# Patient Record
Sex: Male | Born: 1970 | Race: White | Hispanic: No | Marital: Married
Health system: Midwestern US, Academic
[De-identification: ages and names within clinical notes are randomized; demographics above are authoritative.]

---

## 2020-10-27 IMAGING — CT CHEST WO(Adult)
2 of 6 series · 15 of 36 positions shown, 18 images · non-contrast
Comparison: none

PROCEDURE: CHEST WO(Adult)
HISTORY: chest pain, LU chest changes on Xray. AK
TECHNIQUE: Axial CT imaging of the chest was performed without contrast. This exam
was performed using one or more the following dose reduction techniques: Automated
exposure control, adjustment of the mA and/or KV according to the patient's size or use of
iterative reconstruction technique. Total DLP dose measures 259 mGy with a total CTDI
dose measuring 7 mGy.

[Series 4: thorax cor 1.50 br40 s3 · coronal · 0.63mm/px · 3 of 218 slices shown]
[im 44/218  lung]
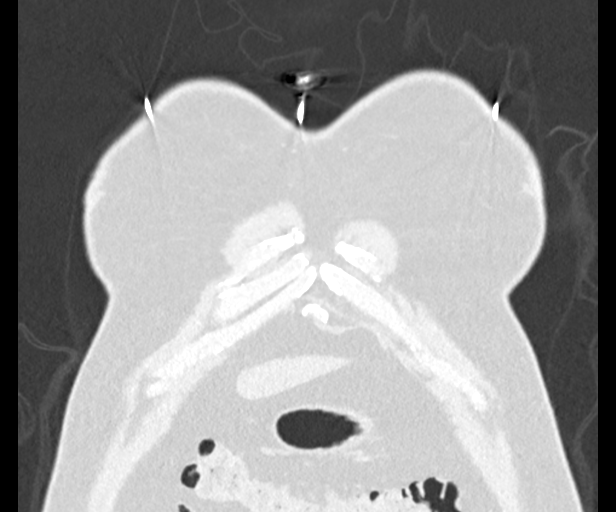
[im 87/218  lung]
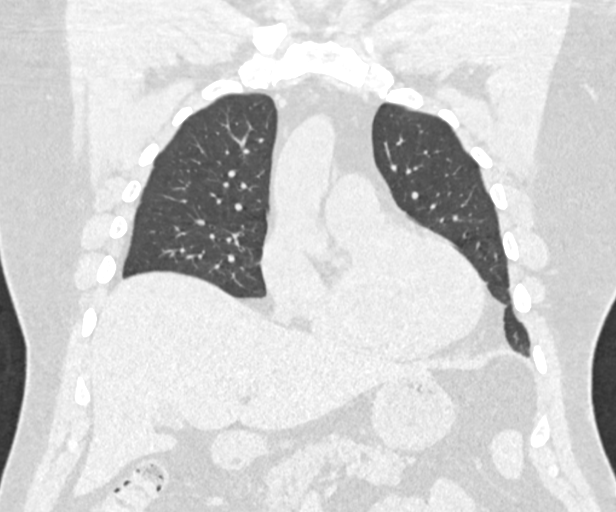
[im 131/218  lung]
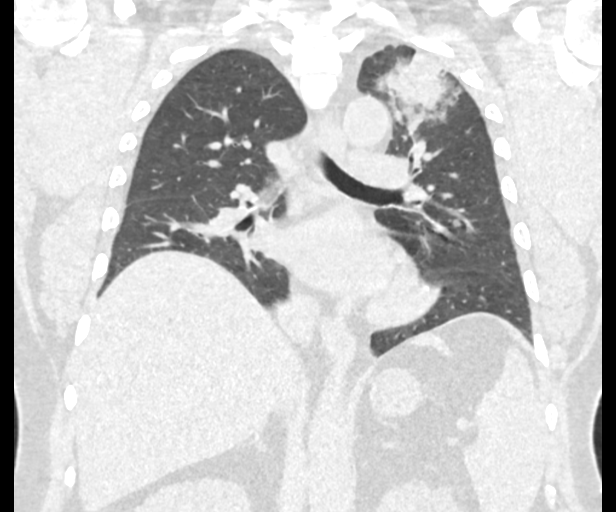

[Series 11: thorax 1.00 br60 s3 · axial · 0.76mm/px · z∈[+1604,+1875]mm · 12 of 458 slices shown, 15 images]
[im 36/458  mediastinal]
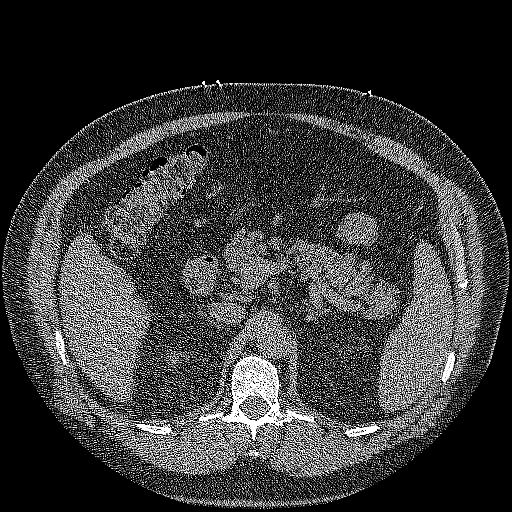
[im 36/458  lung]
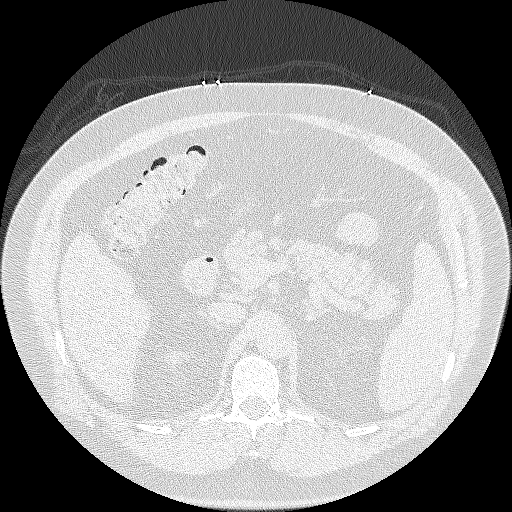
[im 71/458  lung]
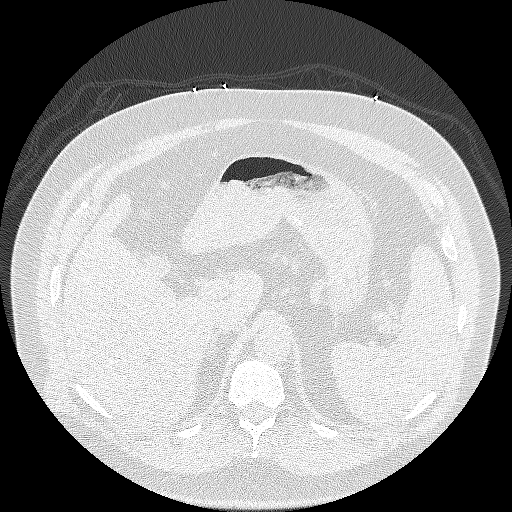
[im 106/458  lung]
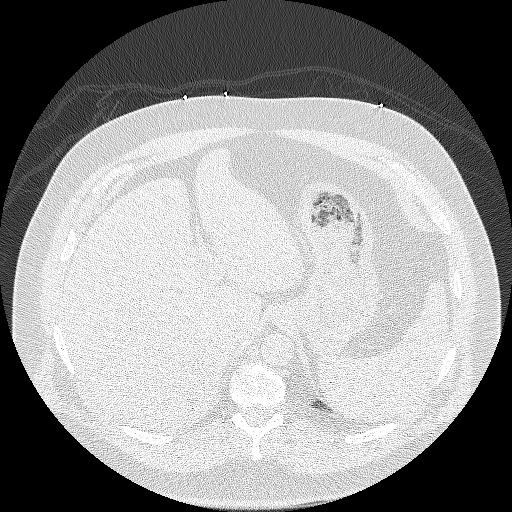
[im 141/458  lung]
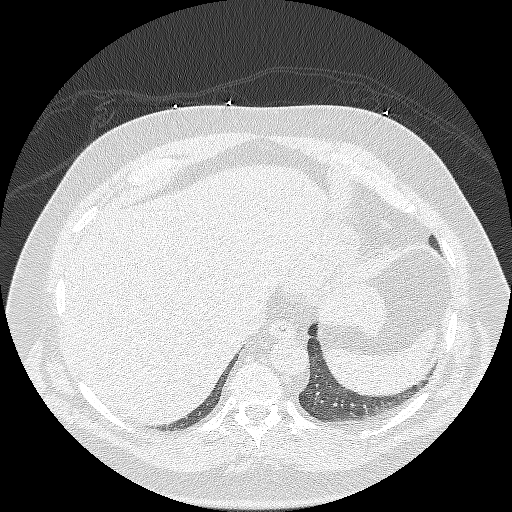
[im 176/458  mediastinal]
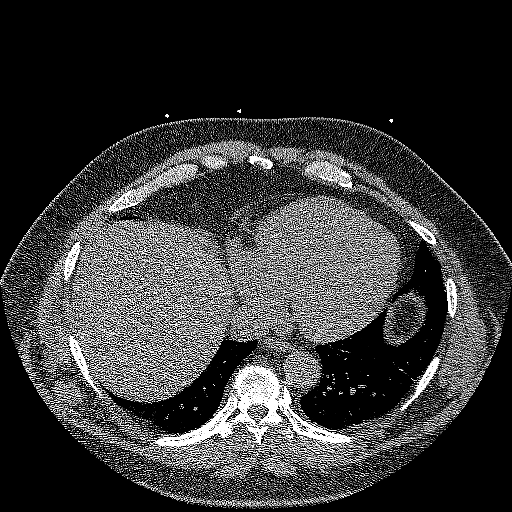
[im 176/458  lung]
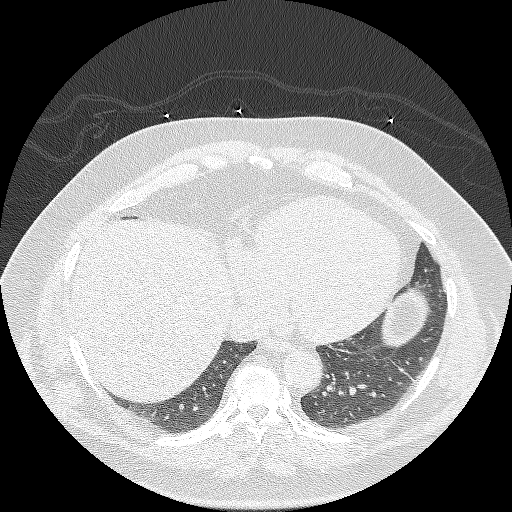
[im 211/458  lung]
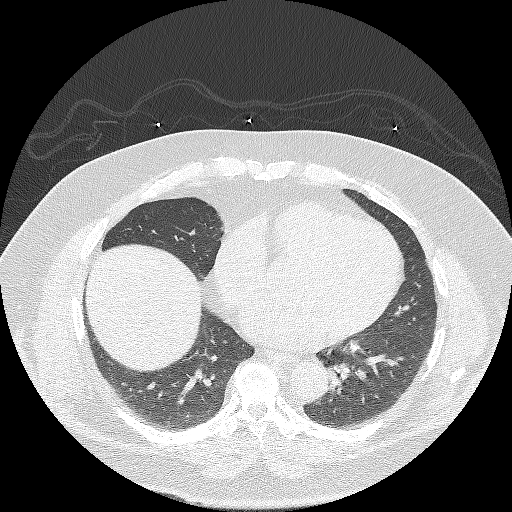
[im 247/458  lung]
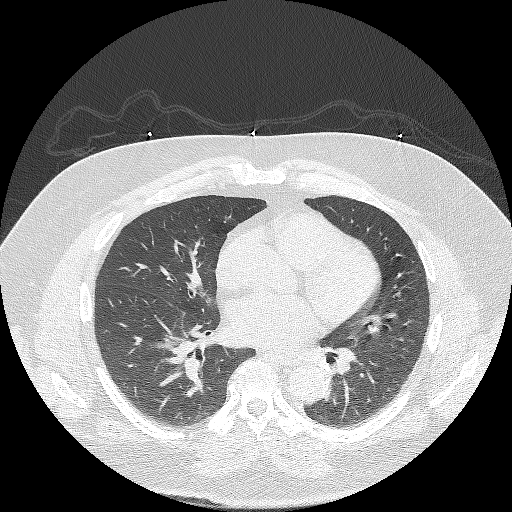
[im 282/458  lung]
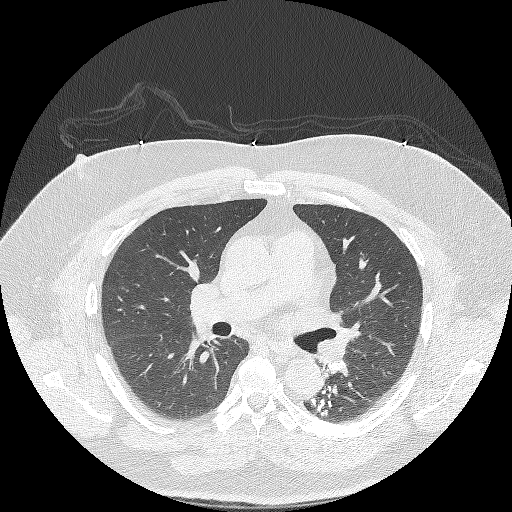
[im 317/458  mediastinal]
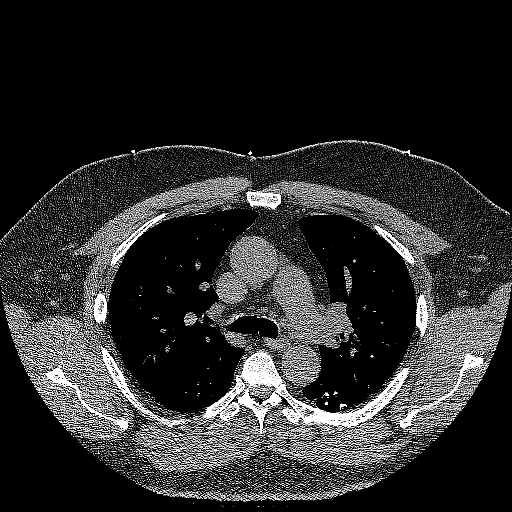
[im 317/458  lung]
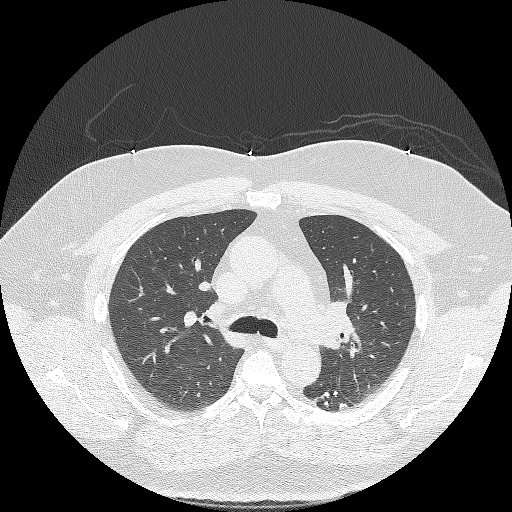
[im 352/458  lung]
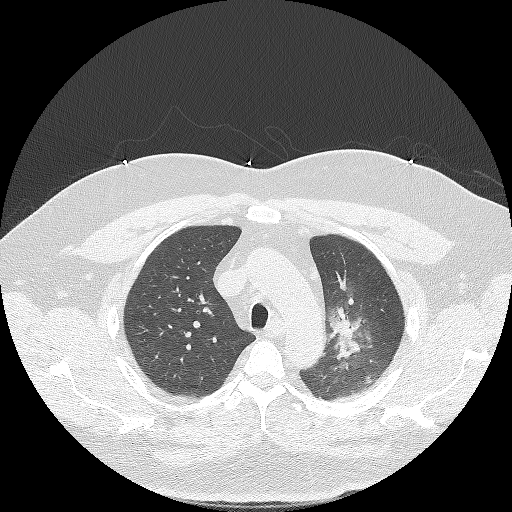
[im 387/458  lung]
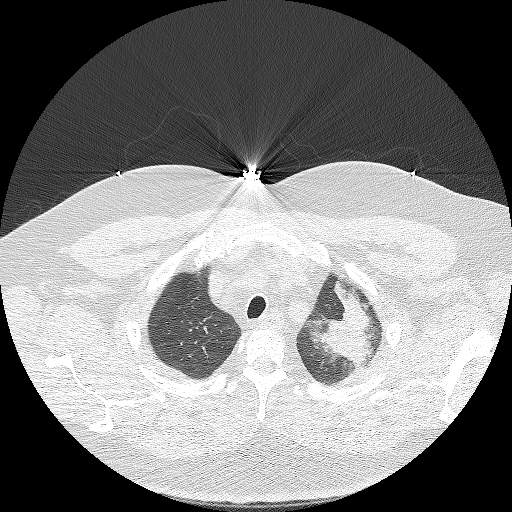
[im 422/458  lung]
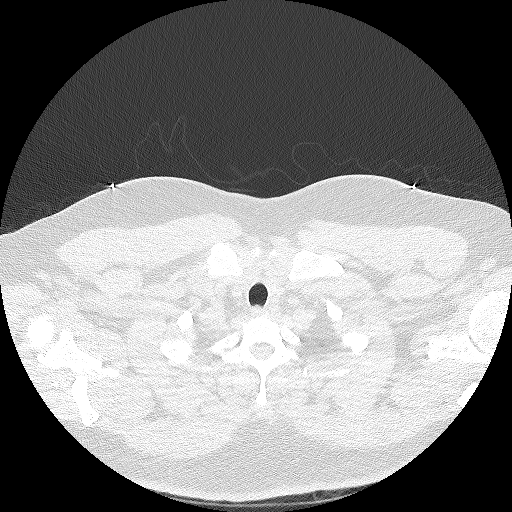

[15 of 36 positions shown; findings below may reference images not displayed]

FINDINGS: There is mild atherosclerotic calcification seen in the large vessels of the
mediastinum. I don't see any mediastinal adenopathy. Cardiac size is within normal limits.
There is no pleural effusion or pneumothorax. The lungs show dense left upper lobe
masslike airspace consolidation. No displaced rib fractures are seen. There are mild
degenerative changes seen in the mid thoracic spine. Limited visualization of the upper
abdomen is unremarkable.
IMPRESSION: 1. Dense left upper lobe pulmonary infiltrate is seen, follow up recommended to ensure
resolution.
2. No mediastinal adenopathy or pleural effusion is seen.

The sensitivity of contrast enhanced CT is much higher for detecting many pathologies over a
noncontrast CT. Correlate with the clinical suspicion for type of pathology.

Tech Notes:

read by maes

## 2021-01-13 ENCOUNTER — Encounter: Admit: 2021-01-13 | Discharge: 2021-01-13

## 2021-10-17 ENCOUNTER — Encounter: Admit: 2021-10-17 | Discharge: 2021-10-17

## 2022-05-09 ENCOUNTER — Encounter: Admit: 2022-05-09 | Discharge: 2022-05-09

## 2022-06-10 ENCOUNTER — Encounter: Admit: 2022-06-10 | Discharge: 2022-06-10

## 2022-06-17 ENCOUNTER — Encounter: Admit: 2022-06-17 | Discharge: 2022-06-17

## 2022-07-03 ENCOUNTER — Ambulatory Visit: Admit: 2022-07-03 | Discharge: 2022-07-03 | Payer: BC Managed Care – PPO

## 2022-07-03 ENCOUNTER — Encounter: Admit: 2022-07-03 | Discharge: 2022-07-03

## 2022-07-03 ENCOUNTER — Ambulatory Visit: Admit: 2022-07-03 | Discharge: 2022-07-03

## 2022-07-03 DIAGNOSIS — G5603 Carpal tunnel syndrome, bilateral upper limbs: Secondary | ICD-10-CM

## 2022-07-03 DIAGNOSIS — Z973 Presence of spectacles and contact lenses: Secondary | ICD-10-CM

## 2022-07-03 DIAGNOSIS — J45909 Unspecified asthma, uncomplicated: Secondary | ICD-10-CM

## 2022-07-03 DIAGNOSIS — M25532 Pain in left wrist: Secondary | ICD-10-CM

## 2022-07-03 DIAGNOSIS — I10 Essential (primary) hypertension: Secondary | ICD-10-CM

## 2022-07-03 DIAGNOSIS — G5601 Carpal tunnel syndrome, right upper limb: Secondary | ICD-10-CM

## 2022-07-03 DIAGNOSIS — E785 Hyperlipidemia, unspecified: Secondary | ICD-10-CM

## 2022-07-03 DIAGNOSIS — M1812 Unilateral primary osteoarthritis of first carpometacarpal joint, left hand: Secondary | ICD-10-CM

## 2022-07-03 DIAGNOSIS — F419 Anxiety disorder, unspecified: Secondary | ICD-10-CM

## 2022-07-03 MED ORDER — SODIUM BICARBONATE 1 MEQ/ML (8.4 %) IV SOLN
50 meq | Freq: Once | 0 refills
Start: 2022-07-03 — End: ?

## 2022-07-03 MED ORDER — LIDOCAINE-EPINEPHRINE 1 %-1:100,000 IJ SOLN
50 mL | Freq: Once | 0 refills
Start: 2022-07-03 — End: ?

## 2022-07-03 MED ORDER — TRIAMCINOLONE ACETONIDE 40 MG/ML IJ SUSP
40 mg | Freq: Once | INTRAMUSCULAR | 0 refills | Status: CP | PRN
Start: 2022-07-03 — End: ?

## 2022-07-03 MED ORDER — LIDOCAINE (PF) 10 MG/ML (1 %) IJ SOLN
1 mL | Freq: Once | INTRAMUSCULAR | 0 refills | Status: CP | PRN
Start: 2022-07-03 — End: ?

## 2022-07-03 MED ORDER — NAPROXEN 500 MG PO TAB
500 mg | ORAL_TABLET | Freq: Two times a day (BID) | ORAL | 0 refills | Status: AC
Start: 2022-07-03 — End: ?

## 2022-07-03 NOTE — Progress Notes
[facility] Hand Surgery      Date of Service: 07/03/2022    Subjective:           Bilateral hand numbness and left thumb pain    History of Present Illness  This is a 52 year old man presenting with years of bilateral hand numbness as well as left thumb pain.  There was no injury.  He reports constant numbness and tingling to the radial aspects of both hands.  Symptoms bother him day and night and wake him from sleep.  He is noticing some weakness in the bilateral hands.  He has tried some bracing which does not help.  Additionally he reports pain around the base of his left thumb which is worse with use.  This has not been treated.  Luis Griffith is a 52 y.o. male.     Review of Systems      Objective:          albuterol sulfate (PROAIR HFA) 90 mcg/actuation HFA aerosol inhaler Inhale one puff to two puffs by mouth into the lungs every 6 hours as needed.    amLODIPine (NORVASC) 10 mg tablet Take one tablet by mouth daily.    escitalopram oxalate (LEXAPRO) 5 mg tablet Take one tablet by mouth daily.    FLUoxetine (PROZAC) 20 mg capsule Take one capsule by mouth daily.    fluticasone propionate (FLOVENT DISKUS) 50 mcg/actuation inhaler Inhale one puff by mouth into the lungs daily.    phentermine (ADIPEX-P) 37.5 mg tablet Take one tablet by mouth every morning.    rosuvastatin (CRESTOR) 20 mg tablet Take one tablet by mouth daily.     Vitals:    07/03/22 1226   PainSc: Zero     There is no height or weight on file to calculate BMI.     Physical Exam  Constitutional:       General: He is not in acute distress.     Appearance: Normal appearance.   Neurological:      Mental Status: He is oriented to person, place, and time.   Psychiatric:         Behavior: Behavior normal.       Ortho Exam  Bilateral hands: The skin is intact, there are no wounds or scars.  He has full range of motion of the wrist and digits.  There is tenderness over the left thumb CMC joint with a positive CMC grind test. He has subjective tingling to the thumb index and middle finger bilaterally.  There is an equivocal direct compression Phalen's at the bilateral carpal tunnel.       Assessment and Plan:  I reviewed his electrodiagnostic studies which show evidence of compression of the median nerve at the bilateral wrist.  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  His right is worse than left.  I have offered him surgical treatment with a right carpal tunnel release under local anesthesia.  He would like to proceed.    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  For this I would recommend trying some conservative care with a steroid injection, brace, and oral anti-inflammatory.  He is welcome to follow-up in a month for this issue but has not helped.    Small Joint Injection/Aspiration: L thumb CMC on 07/03/2022 3:30 PM    Consent:   Consent obtained: verbal  Consent given by: patient  Risks discussed: skin discoloration, bleeding, damage to surrounding structures, hyperglycemia,  infection, pain, soft tissue reaction, subcutaneous fat atrophy, tendon rupture, vasovagal reaction and arrhythmia  Alternatives discussed: alternative treatment, delayed treatment and no treatment  Discussed with patient the purpose of the treatment/procedure, other ways of treating my condition, including no treatment/ procedure and the risks and benefits of the alternatives. Patient has decided to proceed with treatment/procedure.        Universal Protocol:  Relevant documents: relevant documents present and verified  Patient identity confirmed: Patient identify confirmed verbally with patient.          Procedures Details:  Procedure Peformed: Injection Only  Indications: pain  Details:Prep: alcohol   25 G needle, dorsal approachMedications: 40 mg triamcinolone acetonide 40 mg/mL; 1 mL lidocaine PF 1% (10 mg/mL)  Outcome: tolerated well, no immediate complications          The nature of the condition in general and specifically that involving the patient's condition has been reviewed in detail with the patient today.  The indications for surgical versus nonsurgical management of the condition has been advised, including the inherent risks, benefits, prognosis of the condition.  Surgical risks including infection, nerve or blood vessel injury, stiffness, chronic regional pain syndrome, incomplete recovery, incomplete relief or worsening of symptoms, recurrence have been advised.  The patient verbalizes a sound understanding of our discussion and elects to proceed with the proposed procedure.  All questions were answered and no guarantees have been given regarding the patient's condition and treatment recommendations.                       Philomena Course, MD  Associate Professor  Orthopedic Hand Surgery

## 2022-07-03 NOTE — Patient Instructions
It was a pleasure seeing you today. Please contact us if you have any further questions, we are happy to assist.     For nursing/medical/casting or splinting questions, please send a message through MyChart. This is our preferred method of contact and the most efficient way to contact your healthcare team. If you do not have internet access/smartphone you can call my Clinical Nurse Coordinator at [phone]. Please do not leave multiple voicemail's for us. Leaving multiple voicemail's delays us getting back to you quicker.    NOTE: MyChart messages and phone calls received on weekends, on holidays, and after 4 pm on weekdays will NOT be seen until the following business day.     - For medication refills, please ask your pharmacy to send an electronic request.  Please allow at least 3 business days for medication refills.    - To cancel, change, or schedule a clinic appointment: Call  Scheduling at [phone].  - For any radiology concerns or scheduling, please call [phone].     Any insurance, disability or Family Medical Leave Act (FMLA) forms can be faxed to [phone].  Be sure to include your name and date of birth on the form. We will complete and fax the forms where they need to go as soon as we are able. For any questions about disability or FMLA paperwork, please contact Dr. Drake's administrative assistant, Adrianna at [phone].    Do not hesitate to contact our office with any further questions.Thank you and have a great day!    Matthew Drake, MD   [facility]   Orthopedic & Sports Medicine  Office: [phone]  Fax: [phone]

## 2022-07-28 ENCOUNTER — Encounter: Admit: 2022-07-28 | Discharge: 2022-07-28

## 2022-07-28 MED ORDER — NAPROXEN 500 MG PO TAB
500 mg | ORAL_TABLET | Freq: Two times a day (BID) | ORAL | 0 refills | Status: AC
Start: 2022-07-28 — End: ?

## 2022-08-05 ENCOUNTER — Encounter: Admit: 2022-08-05 | Discharge: 2022-08-05

## 2022-08-06 ENCOUNTER — Encounter: Admit: 2022-08-06 | Discharge: 2022-08-06

## 2022-08-06 DIAGNOSIS — M25531 Pain in right wrist: Secondary | ICD-10-CM

## 2022-08-07 ENCOUNTER — Ambulatory Visit: Admit: 2022-08-07 | Discharge: 2022-08-07 | Payer: BC Managed Care – PPO

## 2022-08-07 ENCOUNTER — Encounter: Admit: 2022-08-07 | Discharge: 2022-08-07

## 2022-08-07 ENCOUNTER — Ambulatory Visit: Admit: 2022-08-07 | Discharge: 2022-08-07

## 2022-08-07 DIAGNOSIS — E785 Hyperlipidemia, unspecified: Secondary | ICD-10-CM

## 2022-08-07 DIAGNOSIS — G5603 Carpal tunnel syndrome, bilateral upper limbs: Secondary | ICD-10-CM

## 2022-08-07 DIAGNOSIS — F419 Anxiety disorder, unspecified: Secondary | ICD-10-CM

## 2022-08-07 DIAGNOSIS — M25531 Pain in right wrist: Secondary | ICD-10-CM

## 2022-08-07 DIAGNOSIS — Z973 Presence of spectacles and contact lenses: Secondary | ICD-10-CM

## 2022-08-07 DIAGNOSIS — M19039 Primary osteoarthritis, unspecified wrist: Secondary | ICD-10-CM

## 2022-08-07 DIAGNOSIS — I10 Essential (primary) hypertension: Secondary | ICD-10-CM

## 2022-08-07 DIAGNOSIS — J45909 Unspecified asthma, uncomplicated: Secondary | ICD-10-CM

## 2022-08-07 MED ORDER — TRIAMCINOLONE ACETONIDE 40 MG/ML IJ SUSP
40 mg | Freq: Once | INTRAMUSCULAR | 0 refills | Status: CP | PRN
Start: 2022-08-07 — End: ?

## 2022-08-07 MED ORDER — LIDOCAINE HCL 10 MG/ML (1 %) IJ SOLN
1 mL | Freq: Once | INTRAMUSCULAR | 0 refills | Status: CP | PRN
Start: 2022-08-07 — End: ?

## 2022-08-07 NOTE — Progress Notes
[facility] Hand Surgery      Date of Service: 08/07/2022    Subjective:           Right wrist pain    History of Present Illness  This is a 52 year old man I have evaluated in the past for bilateral carpal tunnel syndrome and left thumb CMC arthritis.  He is doing well with respect to his left thumb and he has surgery scheduled in October for his carpal tunnel.  He comes in today wishing to be evaluated for another issue.  He says he has had longstanding pain symptoms about his right wrist and recently returning a valve work and felt a painful pop.  He reports pain around the ulnar aspect of his wrist which is worse with motion.  Luis Griffith is a 52 y.o. male.     Review of Systems      Objective:          albuterol sulfate (PROAIR HFA) 90 mcg/actuation HFA aerosol inhaler Inhale one puff to two puffs by mouth into the lungs every 6 hours as needed.    amLODIPine (NORVASC) 10 mg tablet Take one tablet by mouth daily.    escitalopram oxalate (LEXAPRO) 5 mg tablet Take one tablet by mouth daily.    FLUoxetine (PROZAC) 20 mg capsule Take one capsule by mouth daily.    fluticasone propionate (FLOVENT DISKUS) 50 mcg/actuation inhaler Inhale one puff by mouth into the lungs daily.    naproxen (NAPROSYN) 500 mg tablet TAKE 1 TABLET BY MOUTH TWICE DAILY WITH MEALS    phentermine (ADIPEX-P) 37.5 mg tablet Take one tablet by mouth every morning.    rosuvastatin (CRESTOR) 20 mg tablet Take one tablet by mouth daily.     Vitals:    08/07/22 1452   PainSc: Six     There is no height or weight on file to calculate BMI.     Physical Exam  Constitutional:       General: He is not in acute distress.     Appearance: Normal appearance.   Neurological:      Mental Status: He is oriented to person, place, and time.   Psychiatric:         Behavior: Behavior normal.       Ortho Exam  Right wrist: The skin is intact, there are no wounds or scars.  There is some mild swelling about the wrist with some tenderness over the ulnar side.  He has mildly restricted range of motion of the wrist in both flexion and extension.  He can make a full fist.  There is intact sensation and brisk cap refill to all digits.       Assessment and Plan:  I reviewed radiographs of his wrist which show evidence of arthritic changes primarily of the radiolunate joint.  I suspect he aggravated that with his recent twisting injury.  I have offered him a steroid injection to help calm this down he would like to proceed.  He already has a wrist brace.  He is welcome to follow-up as needed.    Medium Joint Injection: R ulnocarpal on 08/07/2022 2:50 PM    Consent:   Consent obtained: verbal  Consent given by: patient  Risks discussed: arrhythmia, bleeding, damage to surrounding structures, hyperglycemia, infection, pain, skin discoloration, soft tissue reaction, subcutaneous fat atrophy, tendon rupture and vasovagal reaction  Alternatives discussed: alternative treatment, delayed treatment and no treatment  Discussed with patient the  purpose of the treatment/procedure, other ways of treating my condition, including no treatment/ procedure and the risks and benefits of the alternatives. Patient has decided to proceed with treatment/procedure.        Universal Protocol:  Relevant documents: relevant documents present and verified  Patient identity confirmed: Patient identify confirmed verbally with patient.          Procedures Details:  Procedure Peformed: Injection Only  Indications: pain  Details: 25 G needle, medial approachMedications: 40 mg triamcinolone acetonide 40 mg/mL; 1 mL lidocaine 1 % (10mg /mL)  Outcome: tolerated well, no immediate complications                             Philomena Course, MD  Associate Professor  Orthopedic Hand Surgery

## 2022-08-29 ENCOUNTER — Encounter: Admit: 2022-08-29 | Discharge: 2022-08-29

## 2022-08-29 MED ORDER — NAPROXEN 500 MG PO TAB
500 mg | ORAL_TABLET | Freq: Two times a day (BID) | ORAL | 0 refills
Start: 2022-08-29 — End: ?

## 2022-11-09 ENCOUNTER — Encounter: Admit: 2022-11-09 | Discharge: 2022-11-09

## 2022-11-16 ENCOUNTER — Ambulatory Visit: Admit: 2022-11-16 | Discharge: 2022-11-17 | Payer: BC Managed Care – PPO

## 2022-11-19 ENCOUNTER — Encounter: Admit: 2022-11-19 | Discharge: 2022-11-19

## 2022-11-19 DIAGNOSIS — J45909 Unspecified asthma, uncomplicated: Secondary | ICD-10-CM

## 2022-11-19 DIAGNOSIS — Z973 Presence of spectacles and contact lenses: Secondary | ICD-10-CM

## 2022-11-19 DIAGNOSIS — G5603 Carpal tunnel syndrome, bilateral upper limbs: Secondary | ICD-10-CM

## 2022-11-19 DIAGNOSIS — E785 Hyperlipidemia, unspecified: Secondary | ICD-10-CM

## 2022-11-19 DIAGNOSIS — I10 Essential (primary) hypertension: Secondary | ICD-10-CM

## 2022-11-19 DIAGNOSIS — F419 Anxiety disorder, unspecified: Secondary | ICD-10-CM

## 2022-11-20 ENCOUNTER — Encounter: Admit: 2022-11-20 | Discharge: 2022-11-20

## 2022-11-20 DIAGNOSIS — Z973 Presence of spectacles and contact lenses: Secondary | ICD-10-CM

## 2022-11-20 DIAGNOSIS — F419 Anxiety disorder, unspecified: Secondary | ICD-10-CM

## 2022-11-20 DIAGNOSIS — E785 Hyperlipidemia, unspecified: Secondary | ICD-10-CM

## 2022-11-20 DIAGNOSIS — I10 Essential (primary) hypertension: Secondary | ICD-10-CM

## 2022-11-20 DIAGNOSIS — G5603 Carpal tunnel syndrome, bilateral upper limbs: Secondary | ICD-10-CM

## 2022-11-20 DIAGNOSIS — J45909 Unspecified asthma, uncomplicated: Secondary | ICD-10-CM

## 2022-11-30 ENCOUNTER — Encounter: Admit: 2022-11-30 | Discharge: 2022-11-30

## 2022-11-30 ENCOUNTER — Ambulatory Visit: Admit: 2022-11-30 | Discharge: 2022-12-01 | Payer: BC Managed Care – PPO

## 2022-11-30 DIAGNOSIS — G5603 Carpal tunnel syndrome, bilateral upper limbs: Secondary | ICD-10-CM

## 2022-11-30 DIAGNOSIS — J45909 Unspecified asthma, uncomplicated: Secondary | ICD-10-CM

## 2022-11-30 DIAGNOSIS — Z4789 Encounter for other orthopedic aftercare: Secondary | ICD-10-CM

## 2022-11-30 DIAGNOSIS — Z973 Presence of spectacles and contact lenses: Secondary | ICD-10-CM

## 2022-11-30 DIAGNOSIS — F419 Anxiety disorder, unspecified: Secondary | ICD-10-CM

## 2022-11-30 DIAGNOSIS — E785 Hyperlipidemia, unspecified: Secondary | ICD-10-CM

## 2022-11-30 DIAGNOSIS — I10 Essential (primary) hypertension: Secondary | ICD-10-CM

## 2022-11-30 NOTE — Progress Notes
[facility] Hand Surgery      Date of Service: 11/30/2022    Subjective:           Postop check    History of Present Illness  The patient returns over a week status post his right carpal tunnel release.  He states pain has been minimal but he has not noticed any significant change in his preoperative symptoms.  Luis Griffith is a 52 y.o. male.     Review of Systems      Objective:          albuterol sulfate (PROAIR HFA) 90 mcg/actuation HFA aerosol inhaler Inhale one puff to two puffs by mouth into the lungs every 6 hours as needed.    amLODIPine (NORVASC) 10 mg tablet Take one tablet by mouth daily.    escitalopram oxalate (LEXAPRO) 5 mg tablet Take one tablet by mouth daily.    FLUoxetine (PROZAC) 20 mg capsule Take one capsule by mouth daily.    fluticasone propionate (FLOVENT DISKUS) 50 mcg/actuation inhaler Inhale one puff by mouth into the lungs daily.    naproxen (NAPROSYN) 500 mg tablet TAKE 1 TABLET BY MOUTH TWICE DAILY WITH MEALS    phentermine (ADIPEX-P) 37.5 mg tablet Take one tablet by mouth every morning.    rosuvastatin (CRESTOR) 20 mg tablet Take one tablet by mouth daily.     Vitals:    11/30/22 1322   PainSc: Zero     There is no height or weight on file to calculate BMI.     Physical Exam  Ortho Exam  Right hand: The surgical wound is healed, sutures are in place.  There is no erythema or drainage.  He has full range of motion of the wrist and digits.  He has subjective tingling towards the radial aspect of his right hand.       Assessment and Plan:  He is doing well, the wound has healed.  We will take out the sutures today.  No therapy is needed.  He has not had much of a response yet with respect to his preoperative symptoms, I counseled him that this can often take several weeks to a few months to get to a final result.  I would like him to return to activities as tolerated.  And he should follow-up in 6 weeks for recheck.  Problem   Right Carpal Tunnel Syndrome                       Philomena Course, MD  Associate Professor  Orthopedic Hand Surgery

## 2022-11-30 NOTE — Patient Instructions
It was a pleasure seeing you today. Please contact us if you have any further questions, we are happy to assist.     For nursing/medical/casting or splinting questions, please send a message through MyChart. This is our preferred method of contact and the most efficient way to contact your healthcare team. If you do not have internet access/smartphone you can call my Clinical Nurse Coordinator at [phone]. Please do not leave multiple voicemail's for [place]. Leaving multiple voicemail's delays [place] getting back to you quicker.    NOTE: MyChart messages and phone calls received on weekends, on holidays, and after 4 pm on weekdays will NOT be seen until the following business day.     - For medication refills, please ask your pharmacy to send an electronic request.  Please allow at least 3 business days for medication refills.    - To cancel, change, or schedule a clinic appointment: Call  Scheduling at [phone].  - For any radiology concerns or scheduling, please call [phone].     Any insurance, disability or Family Medical Leave Act [facility]) forms can be faxed to [phone].  Be sure to include your name and date of birth on the form. We will complete and fax the forms where they need to go as soon as we are able. For any questions about disability or FMLA paperwork, please contact Dr. Darden Dates administrative assistant, Karna Christmas at [phone].    Do not hesitate to contact our office with any further questions.Thank you and have a great day!    Philomena Course, MD   [facility]   Orthopedic & Sports Medicine  Office: [phone]  Fax: [phone]

## 2023-01-11 ENCOUNTER — Ambulatory Visit: Admit: 2023-01-11 | Discharge: 2023-01-12 | Payer: BC Managed Care – PPO

## 2023-01-11 ENCOUNTER — Encounter: Admit: 2023-01-11 | Discharge: 2023-01-11 | Payer: BC Managed Care – PPO

## 2023-01-11 DIAGNOSIS — Z4789 Encounter for other orthopedic aftercare: Secondary | ICD-10-CM

## 2023-01-11 NOTE — Progress Notes
[facility] Hand Surgery      Date of Service: 01/11/2023    Subjective:           Postop check    History of Present Illness  The patient returns over 6 weeks status post his right carpal tunnel release.  He states that the sensory function of his hand has not really change since we last saw him but he is not having any significant pain.  He feels like his dexterity in the hand has improved as he is dropping objects less frequently.  He did state that his symptoms were present for quite some time before surgery  Luis Griffith is a 52 y.o. male.     Review of Systems      Objective:          albuterol sulfate (PROAIR HFA) 90 mcg/actuation HFA aerosol inhaler Inhale one puff to two puffs by mouth into the lungs every 6 hours as needed.    amLODIPine (NORVASC) 10 mg tablet Take one tablet by mouth daily.    escitalopram oxalate (LEXAPRO) 5 mg tablet Take one tablet by mouth daily.    FLUoxetine (PROZAC) 20 mg capsule Take one capsule by mouth daily.    fluticasone propionate (FLOVENT DISKUS) 50 mcg/actuation inhaler Inhale one puff by mouth into the lungs daily.    naproxen (NAPROSYN) 500 mg tablet TAKE 1 TABLET BY MOUTH TWICE DAILY WITH MEALS    phentermine (ADIPEX-P) 37.5 mg tablet Take one tablet by mouth every morning.    rosuvastatin (CRESTOR) 20 mg tablet Take one tablet by mouth daily.     Vitals:    01/11/23 1359   PainSc: Zero     There is no height or weight on file to calculate BMI.     Physical Exam  Ortho Exam  Right hand: The surgical wound is healed, the scar is mature.  He has full range of motion of the wrist and digits.  He has subjective tingling to the thumb index and middle finger.  There is normal feeling to the thenar eminence.  He has full strength with resisted index and thumb flexion.  He has no tenderness over the proximal medial forearm       Assessment and Plan:  His symptoms have improved slightly since we last saw him.  I counseled him that given the amount of time he had symptoms before surgery this may have some irreversible nerve injury.  His final result will not likely be known for at least 6 months.  He is welcome to follow-up at that time if he is not satisfied with the result.                       Philomena Course, MD  Associate Professor  Orthopedic Hand Surgery

## 2023-01-11 NOTE — Patient Instructions
It was a pleasure seeing you today. Please contact us if you have any further questions, we are happy to assist.     For nursing/medical/casting or splinting questions, please send a message through MyChart. This is our preferred method of contact and the most efficient way to contact your healthcare team. If you do not have internet access/smartphone you can call my Clinical Nurse Coordinator at [phone]. Please do not leave multiple voicemail's for [place]. Leaving multiple voicemail's delays [place] getting back to you quicker.    NOTE: MyChart messages and phone calls received on weekends, on holidays, and after 4 pm on weekdays will NOT be seen until the following business day.     - For medication refills, please ask your pharmacy to send an electronic request.  Please allow at least 3 business days for medication refills.    - To cancel, change, or schedule a clinic appointment: Call  Scheduling at [phone].  - For any radiology concerns or scheduling, please call [phone].     Any insurance, disability or Family Medical Leave Act [facility]) forms can be faxed to [phone].  Be sure to include your name and date of birth on the form. We will complete and fax the forms where they need to go as soon as we are able. For any questions about disability or FMLA paperwork, please contact Dr. Darden Dates administrative assistant, Karna Christmas at [phone].    Do not hesitate to contact our office with any further questions.Thank you and have a great day!    Philomena Course, MD   [facility]   Orthopedic & Sports Medicine  Office: [phone]  Fax: [phone]

## 2023-01-29 ENCOUNTER — Encounter: Admit: 2023-01-29 | Discharge: 2023-01-29 | Payer: BC Managed Care – PPO

## 2023-02-19 ENCOUNTER — Encounter: Admit: 2023-02-19 | Discharge: 2023-02-19 | Payer: BC Managed Care – PPO

## 2023-02-19 ENCOUNTER — Encounter: Admit: 2023-02-19 | Discharge: 2023-02-19 | Payer: BLUE CROSS/BLUE SHIELD

## 2023-02-19 NOTE — Telephone Encounter
02/19/2023  Records request faxed per workqueue task below. [phone]  sdc    Pcp, Dr. Sherene Sires, [place], [phone]

## 2023-03-02 ENCOUNTER — Encounter: Admit: 2023-03-02 | Discharge: 2023-03-02 | Payer: BC Managed Care – PPO

## 2023-03-09 ENCOUNTER — Observation Stay: Admit: 2023-03-09 | Discharge: 2023-03-09 | Payer: BC Managed Care – PPO

## 2023-03-09 ENCOUNTER — Ambulatory Visit: Admit: 2023-03-09 | Discharge: 2023-03-09 | Payer: BC Managed Care – PPO

## 2023-03-09 ENCOUNTER — Ambulatory Visit: Admit: 2023-03-09 | Discharge: 2023-03-11 | Payer: BC Managed Care – PPO

## 2023-03-09 ENCOUNTER — Encounter: Admit: 2023-03-09 | Discharge: 2023-03-09 | Payer: BC Managed Care – PPO

## 2023-03-09 DIAGNOSIS — E66812 Class 2 obesity with body mass index (BMI) of 36.0 to 36.9 in adult, unspecified obesity type, unspecified whether serious comorbidity present: Secondary | ICD-10-CM

## 2023-03-09 DIAGNOSIS — Z136 Encounter for screening for cardiovascular disorders: Secondary | ICD-10-CM

## 2023-03-09 DIAGNOSIS — R9431 Abnormal electrocardiogram [ECG] [EKG]: Secondary | ICD-10-CM

## 2023-03-09 DIAGNOSIS — R0609 Other forms of dyspnea: Secondary | ICD-10-CM

## 2023-03-09 DIAGNOSIS — I272 Pulmonary hypertension, unspecified: Secondary | ICD-10-CM

## 2023-03-09 DIAGNOSIS — I10 Essential (primary) hypertension: Secondary | ICD-10-CM

## 2023-03-09 NOTE — Patient Instructions
[facility] will call you with a bed when available     Follow up as directed.  Call sooner if issues.  Call the [facility] nursing line at [phone].  Leave a detailed message for the nurse in [place] with how we can assist you and we will call you back.

## 2023-03-09 NOTE — Progress Notes
Date of Service: 03/09/2023    Luis Griffith is a 53 y.o. male.       HPI   Mr. Luis Griffith presents with chest discomfort and dyspnea with exertion.  Evidently he has been having chest discomfort intermittently for years but it started to increase in November 2024.  He is now having chest discomfort nearly daily.  He may be having several different types of chest discomfort.  Mostly his chest discomfort is pinpoint and is located in different areas in both pectoral areas.  It may last for seconds, minutes or hours at a time.  Sometimes the discomfort occurs while he is working and sometimes it occurs at night at rest or when he is sleeping.  When it occurs at night it tends to be a sharp pinpoint discomfort that resolves within seconds when he changes his position.  However, he also develops discomfort at work that may be sharp but at other times may be described as a tightness.  The discomfort that occurs at work may be associated with dyspnea but usually not diaphoresis or lightheadedness.  The discomfort is not tender to touch and is not suggestive for indigestion.  The patient also describes worsening dyspnea with exertion over the past 3 months.  It has worsened even more over the past month.  He has difficulty sometimes performing even mild activity such as dressing without developing dyspnea with exertion.  He believes that his dyspnea with exertion has increased significantly over the past month.  He has had to lessen his work hours because of his dyspnea with exertion.  He reports no nocturnal dyspnea or orthopnea.    Mr. Luis Griffith reports that his blood pressure has been mildly elevated on amlodipine.  He has been compliant taking his amlodipine.  For some time he was not compliant taking his rosuvastatin but he has been taking his rosuvastatin nightly since December 2024.  He reports no palpitations, lightheadedness, falls, presyncope or syncope.  Mr. Luis Griffith reports no bleeding abnormalities, claudication, or strokelike symptoms.  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  He reports that he has 2 cousins who have been diagnosed with sarcoid.  He uses smokeless tobacco perhaps once a week.  He smoked cigarettes sporadically years ago but has not smoked cigarettes in 2 years.  He did have carpal tunnel surgery in his right hand in September 2024. Mr. Luis Griffith reports that he was tested for sleep apnea perhaps 2 years ago.  He reports that his sleep study was mildly abnormal but not sufficiently abnormal to warrant CPAP.  He does report that he has been having increasing tiredness throughout the day.       Vitals:    03/09/23 0845   PainSc: Zero     There is no height or weight on file to calculate BMI.     Past Medical History  Patient Active Problem List    Diagnosis Date Noted    Abnormal EKG 03/09/2023    Dyspnea on exertion 03/09/2023    Obesity 03/09/2023    Pulmonary hypertension (HCC) 03/09/2023    Right carpal tunnel syndrome 11/19/2022    Essential hypertension 09/23/2017    Hyperlipidemia 09/22/2017     Last Assessment & Plan: Formatting of this note might be different from the original.  FLP not at goal; LDL 116 and HDL 39.    Plan: Will hold off on starting any treatment pending stress test results.  Review of Systems   Constitutional: Negative.   HENT: Negative.     Eyes: Negative.    Cardiovascular:  Positive for chest pain, dyspnea on exertion and palpitations.   Respiratory:  Positive for shortness of breath.    Endocrine: Negative.    Hematologic/Lymphatic: Negative.    Skin: Negative.    Musculoskeletal: Negative.    Gastrointestinal: Negative.    Genitourinary: Negative.    Neurological: Negative.    Psychiatric/Behavioral: Negative.     Allergic/Immunologic: Negative.        Physical Exam  GENERAL: The patient is well developed, well nourished, resting comfortably and in no distress.   HEENT: No abnormalities of the visible oro-nasopharynx, conjunctiva or sclera are noted.  NECK: There is no jugular venous distension. Carotids are palpable and without bruits. There is no thyroid enlargement.  Chest: Lung fields are clear to auscultation. There are no wheezes or crackles.  His chest wall is not tender to touch and there is no active costochondritis.  CV: There is a regular rhythm. The first and second heart sounds are normal. There are no murmurs, gallops or rubs.  ABD: The abdomen is soft and supple with normal bowel sounds. There is no hepatosplenomegaly, ascites, tenderness, masses or bruits.  Neuro: There are no focal motor defects. Ambulation is normal. Cognitive function appears normal.  Ext: There is no edema or evidence of deep vein thrombosis. Peripheral pulses are satisfactory.    SKIN: There are no rashes and no cellulitis  PSYCH: The patient is calm, rationale and oriented.    Cardiovascular Studies  A twelve-lead ECG obtained on 03/09/2023 reveals normal sinus rhythm with a heart rate of 77 bpm.  There is no evidence of myocardial ischemia or infarction.    An outside chest x-ray dated 02/05/2023 refers to: 1) indeterminant bibasilar interstitial pulmonary opacities may represent chronic fibrotic changes.Marland KitchenMarland Kitchen 2) nonspecific suspected mild dilatation of the central pulmonary vasculature.    Cardiovascular Health Factors  Vitals BP Readings from Last 3 Encounters:   11/19/22 137/83   06/17/22 136/83   06/17/22 136/83     Wt Readings from Last 3 Encounters:   11/19/22 112.9 kg (249 lb)   06/17/22 108.2 kg (238 lb 9.6 oz)     BMI Readings from Last 3 Encounters:   11/19/22 36.77 kg/m?   06/17/22 35.24 kg/m?      Smoking Social History     Tobacco Use   Smoking Status Some Days    Current packs/day: 0.25    Types: Cigarettes   Smokeless Tobacco Current    Types: Chew      Lipid Profile No results found for: CHOL  No results found for: HDL  No results found for: LDL  No results found for: TRIG   Blood Sugar No results found for: HGBA1C  No results found for: GLU, GLUF, GLUPOC       Problems Addressed Today  Encounter Diagnoses   Name Primary?    Screening for heart disease Yes       Assessment and Plan   1) I am concerned about Mr.Luis Griffith's chest discomfort.  He may be having several types of chest discomfort but it has been worsening over the past month and it has increased to the point that he has considered going to the emergency room several times.  I have recommended immediate hospitalization for further evaluation and treatment.  I would recommend obtaining a PET/CT myocardial perfusion imaging scan to evaluate for objective evidence of myocardial  ischemia.  I would also recommend obtaining an echo Doppler study to assess for structural heart disease.  Mr. Luis Griffith has had carpal tunnel surgery in September 2024, so that evaluation for amyloid heart disease is also recommended.  2) I am also concerned about Mr. Luis Griffith progressive dyspnea with exertion.  It has gotten to the point where he is having difficulty walking short distances and completing household activities.  Two of his cousins have been diagnosed with sarcoidosis.  I would recommend consultation with pulmonary medicine and obtaining further testing with pulmonary function tests and thoracic CT scanning as determined by pulmonary medicine.  3) his blood pressure may need further treatment as well.  The addition of a diuretic to amlodipine may help control his blood pressure.  As mentioned, I have recommended immediate hospitalization today.  I would like to see the patient back in clinic within the next 3 months following hospitalization today. The total time spent during this interview and exam with preparation and chart review was 60 minutes.         Current Medications (including today's revisions)   albuterol sulfate (PROAIR HFA) 90 mcg/actuation HFA aerosol inhaler Inhale one puff to two puffs by mouth into the lungs every 6 hours as needed.    amLODIPine (NORVASC) 10 mg tablet Take one tablet by mouth daily.    escitalopram oxalate (LEXAPRO) 5 mg tablet Take one tablet by mouth daily.    FLUoxetine (PROZAC) 20 mg capsule Take one capsule by mouth daily.    fluticasone propionate (FLOVENT DISKUS) 50 mcg/actuation inhaler Inhale one puff by mouth into the lungs daily.    naproxen (NAPROSYN) 500 mg tablet TAKE 1 TABLET BY MOUTH TWICE DAILY WITH MEALS    phentermine (ADIPEX-P) 37.5 mg tablet Take one tablet by mouth every morning.    rosuvastatin (CRESTOR) 20 mg tablet Take one tablet by mouth daily.

## 2023-03-10 ENCOUNTER — Observation Stay: Admit: 2023-03-10 | Discharge: 2023-03-11 | Payer: BC Managed Care – PPO

## 2023-03-10 ENCOUNTER — Observation Stay: Admit: 2023-03-10 | Discharge: 2023-03-10 | Payer: BC Managed Care – PPO

## 2023-03-10 ENCOUNTER — Encounter: Admit: 2023-03-10 | Discharge: 2023-03-10 | Payer: BC Managed Care – PPO

## 2023-03-11 ENCOUNTER — Encounter: Admit: 2023-03-11 | Discharge: 2023-03-11 | Payer: BC Managed Care – PPO

## 2023-03-11 ENCOUNTER — Observation Stay: Admit: 2023-03-11 | Discharge: 2023-03-11 | Payer: BC Managed Care – PPO

## 2023-03-12 ENCOUNTER — Encounter: Admit: 2023-03-12 | Discharge: 2023-03-12 | Payer: BC Managed Care – PPO

## 2023-03-12 NOTE — Telephone Encounter
 Left message for patient to call back to schedule procedure.  Will await call from patient.  Tanda Rockers, RN

## 2023-03-12 NOTE — Telephone Encounter
Orders placed. Tanda Rockers, RN

## 2023-03-16 ENCOUNTER — Encounter: Admit: 2023-03-16 | Discharge: 2023-03-16 | Payer: BC Managed Care – PPO

## 2023-03-22 ENCOUNTER — Ambulatory Visit: Admit: 2023-03-22 | Discharge: 2023-03-22 | Payer: BC Managed Care – PPO

## 2023-03-23 ENCOUNTER — Ambulatory Visit: Admit: 2023-03-23 | Discharge: 2023-03-23 | Payer: BC Managed Care – PPO

## 2023-03-23 ENCOUNTER — Encounter: Admit: 2023-03-23 | Discharge: 2023-03-23 | Payer: BC Managed Care – PPO

## 2023-03-23 MED ORDER — PROPOFOL INJ 10 MG/ML IV VIAL
INTRAVENOUS | 0 refills | Status: DC
Start: 2023-03-23 — End: 2023-03-23

## 2023-03-23 MED ORDER — SUCCINYLCHOLINE CHLORIDE 20 MG/ML IJ SOLN
INTRAVENOUS | 0 refills | Status: DC
Start: 2023-03-23 — End: 2023-03-23

## 2023-03-23 MED ORDER — PROPOFOL 10 MG/ML IV EMUL 50 ML (INFUSION)(AM)(OR)
INTRAVENOUS | 0 refills | Status: DC
Start: 2023-03-23 — End: 2023-03-23
  Administered 2023-03-23: 15:00:00 150 ug/kg/min via INTRAVENOUS

## 2023-03-23 MED ORDER — ROCURONIUM 10 MG/ML IV SOLN
INTRAVENOUS | 0 refills | Status: DC
Start: 2023-03-23 — End: 2023-03-23

## 2023-03-23 MED ORDER — ONDANSETRON HCL (PF) 4 MG/2 ML IJ SOLN
INTRAVENOUS | 0 refills | Status: DC
Start: 2023-03-23 — End: 2023-03-23

## 2023-03-23 MED ORDER — LIDOCAINE (PF) 20 MG/ML (2 %) IJ SOLN
INTRAVENOUS | 0 refills | Status: DC
Start: 2023-03-23 — End: 2023-03-23

## 2023-03-23 MED ORDER — ALBUTEROL SULFATE 90 MCG/ACTUATION IN HFAA
RESPIRATORY_TRACT | 0 refills | Status: DC
Start: 2023-03-23 — End: 2023-03-23

## 2023-03-23 MED ORDER — SUGAMMADEX 100 MG/ML IV SOLN
INTRAVENOUS | 0 refills | Status: DC
Start: 2023-03-23 — End: 2023-03-23

## 2023-03-23 MED ORDER — DEXAMETHASONE SODIUM PHOSPHATE 4 MG/ML IJ SOLN
INTRAVENOUS | 0 refills | Status: DC
Start: 2023-03-23 — End: 2023-03-23

## 2023-03-23 MED ORDER — FENTANYL CITRATE (PF) 50 MCG/ML IJ SOLN
INTRAVENOUS | 0 refills | Status: DC
Start: 2023-03-23 — End: 2023-03-23

## 2023-03-23 MED ORDER — ARTIFICIAL TEARS (PF) SINGLE DOSE DROPS GROUP
OPHTHALMIC | 0 refills | Status: DC
Start: 2023-03-23 — End: 2023-03-23

## 2023-03-23 MED ADMIN — SODIUM CHLORIDE 0.9% IV SOLP [27838]: 1000.0000 mL | INTRAVENOUS | @ 15:00:00 | Stop: 2023-03-23 | NDC 00338004904

## 2023-03-25 ENCOUNTER — Encounter: Admit: 2023-03-25 | Discharge: 2023-03-25 | Payer: BC Managed Care – PPO

## 2023-03-30 ENCOUNTER — Encounter: Admit: 2023-03-30 | Discharge: 2023-03-30 | Payer: BC Managed Care – PPO

## 2023-03-30 DIAGNOSIS — D869 Sarcoidosis, unspecified: Secondary | ICD-10-CM

## 2023-03-30 NOTE — Telephone Encounter
Orders placed.  Contacted PFT lab, PFTs scheduled for 04/14/2023 at 9am.  Spoke with pt and updated of plan for PFTs and offered appointment with Dr. Bryson Corona at 1:15 pm on 04/14/2023.  Pt is agreeable to plan.

## 2023-03-30 NOTE — Telephone Encounter
-----   Message from Charlann Boxer, MBBS sent at 03/30/2023  7:59 AM CST -----  Regarding: Follow up for sarcoidosis, shortness of air  Good morning!    Dr.Stevens and I saw this patient on consults and I would like to see this guy in clinic.     He underwent a EBUS showing sarcoidosis - I would like to get Full PFTs with bronchodilator. I would also like to get ESR, CRP, Immunoglobulin levels and 1,25 vitamin D as well as 25 vitamin D.    Would you be able to set him up to see me in clinic - I think I have an opening on the 19th of Feb.    Thanks again,     [employer]

## 2023-04-11 ENCOUNTER — Encounter: Admit: 2023-04-11 | Discharge: 2023-04-11 | Payer: BC Managed Care – PPO

## 2023-04-14 ENCOUNTER — Ambulatory Visit: Admit: 2023-04-14 | Discharge: 2023-04-14 | Payer: BC Managed Care – PPO

## 2023-04-14 ENCOUNTER — Ambulatory Visit: Admit: 2023-04-14 | Discharge: 2023-04-15 | Payer: BC Managed Care – PPO

## 2023-04-14 ENCOUNTER — Encounter: Admit: 2023-04-14 | Discharge: 2023-04-14 | Payer: BC Managed Care – PPO

## 2023-04-14 DIAGNOSIS — D860 Sarcoidosis of lung: Secondary | ICD-10-CM

## 2023-04-14 DIAGNOSIS — Z79899 Other long term (current) drug therapy: Secondary | ICD-10-CM

## 2023-04-14 MED ORDER — PANTOPRAZOLE 40 MG PO TBEC
40 mg | ORAL_TABLET | Freq: Every day | ORAL | 11 refills | 90.00000 days | Status: AC
Start: 2023-04-14 — End: ?

## 2023-04-14 MED ORDER — FOLIC ACID 1 MG PO TAB
ORAL_TABLET | 11 refills | 30.00000 days | Status: AC
Start: 2023-04-14 — End: ?

## 2023-04-14 MED ORDER — SULFAMETHOXAZOLE-TRIMETHOPRIM 800-160 MG PO TAB
1 | ORAL_TABLET | ORAL | 1 refills | Status: AC
Start: 2023-04-14 — End: ?

## 2023-04-14 MED ORDER — PREDNISONE 20 MG PO TAB
ORAL_TABLET | 1 refills | Status: AC
Start: 2023-04-14 — End: ?

## 2023-04-14 MED ORDER — METHOTREXATE SODIUM 2.5 MG PO TAB
ORAL_TABLET | 0 refills | 28.00000 days | Status: AC
Start: 2023-04-14 — End: ?

## 2023-04-14 NOTE — Patient Instructions
 Clinic Visit Summary:     Next clinic visit follow up with Dr Samuella Cota recommended in 2 months    Please contact Pulmonary Nurse Coordinator with signs and symptoms of worsening productive cough with thick secretions, blood in sputum, chest tightness/pain, shortness of breath, fever, chills, night sweats, or any questions or concerns.     ILD/Sarcoidosis Clinical Care Coordinators: Jeanette Caprice, RN [phone], Murriel Hopper, RN [phone], Antionette Poles, RN [phone], and Adine Madura, RN [phone].    For refills on medications, please have your pharmacy fax a refill authorization request form to our office at Fax) [phone]. Please allow at least 3 business days for refill requests.   For urgent issues after business hours/weekends/holidays call [phone] and request for the pulmonary fellow to be paged. For scheduling questions/concerns, please call [phone].

## 2023-04-14 NOTE — Progress Notes
 Initial Outpatient Evaluation  Sarcoidosis Evaluation  [facility]    Date of Service: 04/14/23    Subjective      History of Present Illness    Mr. Luis Griffith is a 53 y.o. old male PMH HTN, HPL who presents to establish care for Pulmonary Sarcoidosis.    04/2021  Patient was diagnosed with pneumonia - felt tired, exhausted and shortness of breath  Was treated with prednisone and doxycycline - took if for 5 days - took a while to get better but eventually did    12/2022  Felt incredibly short of breath - slowly occurred over a couple weeks   Went to his PCP - was sent to Cardiology     02/2023  PET scan normal  Stress Test normal  Ultimately sent for EBUS and BAL - biopsy consistent with granulomas     03/2023  Was placed on 5 mg of prednisone and feels a bit better     Globally patient feels that his dyspnea is very limiting - has not been able to work because of it.    Presents today to establish care.          Pulmonary Manifestation Screen    Symptom Response   Shortness of Breath YES - with exertion   Cough NO   Wheezing/Chest Tightness YES - albuterol does improve it   Abnormal Imaging YES - mediastinal adenopathy and perifissural nodularity    Chest Pain YES     Cousins have sarcoidosis (Dad's side)     Extrapulmonary Manifestation Screen    Symptom Response   Constitutional    Fatigue YES   Lymphadenopathy  NO   Fevers/Chills YES - does have night sweats    Weight Loss YES    Central Nervous System    Facial Droop NO   Numbness/Tingling NO   Seizures/Mental Status Changes NO   Opthalmology    Eye Pain/Redness NO   Vision Issues NO   Cardiology    Palpitations NO   Dizziness/Pre Syncope NO   Chest Pain NO   Dermatologic    Skin Rashes/Nodules/Plaques NO   Hyper or Hypo Pigmentation NO   Musculoskeletal     Arthralgias YES - knees   Muscle Pain/Tenderness NO   Renal    Kidney Disease NO   Kidney Stones NO     Review of Systems  Positive symptoms in bold  CONSTITUTIONAL: fatigue, fevers, night sweats, weight loss  HEENT: epistaxis, rhinorrhea, changes in vision, double vision, blurry vision  CV: palpitations, chest pain, LE edema  RESP: shortness of breath, wheezing, cough, productive sputum  GI: nausea, vomiting, abdominal pain, diarrhea, melena, hematochezia  GU: dysuria, hematuria, frequency, urgency, flank pain  HEME: easy bleeding, bruising, gingival bleeding, lymphadenopathy  MUSK: thoracic pain, lumbar pain, cervical pain, arthralgia  NEURO: headache, weakness, gait instability  PSYCH: altered mood, depression, manic symptoms          Objective:         Current Outpatient Medications on File Prior to Visit   Medication Sig Dispense Refill    albuterol sulfate (PROAIR HFA) 90 mcg/actuation HFA aerosol inhaler Inhale one puff to two puffs by mouth into the lungs every 6 hours as needed.      amLODIPine (NORVASC) 10 mg tablet Take one tablet by mouth daily.      ascorbic acid (vitamin C) (VITAMIN C) 1,000 mg tablet Take one tablet by mouth daily.      aspirin EC (  ASPIR-LOW) 81 mg tablet Take one tablet by mouth daily.      diclofenac sodium (VOLTAREN) 1 % topical gel Apply four g topically to affected area four times daily.      fish oil /omega-3 fatty acids (SEA-OMEGA) 340/1000 mg capsule Take one capsule by mouth daily with breakfast.      FLUoxetine (PROZAC) 20 mg capsule Take one capsule by mouth daily.      rosuvastatin (CRESTOR) 20 mg tablet Take one tablet by mouth every evening.       No current facility-administered medications on file prior to visit.       Physical Exam  Vitals:    04/14/23 1256   BP: (!) 152/72   Pulse: 84   Temp: 36.3 ?C (97.4 ?F)   Resp: 18   SpO2: 97%     Body mass index is 35.44 kg/m?Marland Kitchen    General: Awake alert, well-appearing, no acute distress  HEENT: Atraumatic, normocephalic, PERRL, EOMI, neck supple, no JVD  CV: RRR, no m/r/g, no LE edema  RESP: Lungs CTAB, no wheezes/rhonchi/rales  GI: Abdomen soft, nontender, non distended, +BS  MUSK: No focal pinpoint tenderness, joints ROM grossly in tact  Neuro: CN II-XII grossly in tact, no focal deficits  Derm: no rashes, lesions, abnormal pigmentation    Pulmonary Function Tests:   Complete PFT Absolute FVC-Pre FEV1-Pre RVPleth-Pre TLCPleth-Pre DLCOunc-%Pred-Pre   Latest Ref Rng & Units L L L L %   03/10/2023   9:52 AM 3.15  2.47       04/14/2023   9:01 AM 3.17  2.55  1.68  5.03  83      Complete PFT Percent Predicted FVC-%Pred-pre FEV1-%Pred-Pre RVPleth-%Pred-Pre TLCPleth-%Pred-Pre DLCOunc-%Pred-Pre   Latest Ref Rng & Units % % % % %   03/10/2023   9:52 AM 72  71       04/14/2023   9:01 AM 72  73  80  74  83          Imaging  CT Chest 02/2023  Larger mediastinal and bilateral hilar lymphadenopathy since 2022, favored   to reflect sarcoidosis or granulomatous infection. Lymphoproliferative   disorder is less likely though not excluded.     Pathology      Assessment       Assessment and Plan:  53 year old male with PMH as below who presents to establish care.    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  We will start at 10 mg once weekly.  This is a starting dose and we will plan to continue to titrate up by 2.5 mg every 1-2 weeks until we reach 15 mg once weekly.  Folic acid supplementation with 1 mg daily is necessary to avoid any toxicity to peripheral nerves.   The potential side effects of treatment were discussed and include nausea/GI upset and liver toxicity as the main reasons for dose reduction or drug discontinuation.  Fatigue for 24-48 hours after a dose  is also common.  If this occurs, increase folic acid to 5 mg 12 hours after a dose.  Liver toxicity can occur, but it is usually detected early with routine lab monitoring.  Lung toxicity can occur, but it is rare in sarcoidosis.  Other side effects listed can occur, but are generally uncommon.  Consequently, we will plan for a CBC and CMP once monthly labs for the first six months.  If everything is stable following 6 months on treatment, we will monitor labs every 3 months.  We discussed that improvement will take time.  The drug may take 8-12 weeks to reach steady state levels in the body, and some patients may not experience any noticeable change in symptoms until they have been on treatment for 6 months.  Peak improvement is often not seen for 9-12 months.        Total Time Today was 70 minutes in the following activities: Preparing to see the patient, Obtaining and/or reviewing separately obtained history, Performing a medically appropriate examination and/or evaluation, Counseling and educating the patient/family/caregiver, Ordering medications, tests, or procedures, Referring and communication with other health care professionals (when not separately reported), Documenting clinical information in the electronic or other health record, Independently interpreting results (not separately reported) and communicating results to the patient/family/caregiver, and Care coordination (not separately reported)    Lucretia Field MD  Pulmonary and Critical Care

## 2023-04-20 ENCOUNTER — Encounter: Admit: 2023-04-20 | Discharge: 2023-04-20 | Payer: BC Managed Care – PPO

## 2023-05-08 ENCOUNTER — Encounter: Admit: 2023-05-08 | Discharge: 2023-05-08 | Payer: BC Managed Care – PPO

## 2023-05-10 ENCOUNTER — Encounter: Admit: 2023-05-10 | Discharge: 2023-05-10 | Payer: BC Managed Care – PPO

## 2023-05-12 LAB — COMPREHENSIVE METABOLIC PANEL
A/G RATIO-MP: 1.5
ALBUMIN: 4
ALK PHOSPHATASE: 70
ALT: 33
ANION GAP: 12
AST: 19
BUN/CREATININE RATIO: 22 — ABNORMAL HIGH
CALCIUM: 8.8
CREATININE: 0.8
GFR ESTIMATED: 104
GLOBULIN: 2.7
GLUCOSE,PANEL: 99
TOTAL BILIRUBIN: 0.5
TOTAL PROTEIN: 6.7

## 2023-05-13 ENCOUNTER — Encounter: Admit: 2023-05-13 | Discharge: 2023-05-13 | Payer: BC Managed Care – PPO

## 2023-05-13 DIAGNOSIS — Z79899 Other long term (current) drug therapy: Secondary | ICD-10-CM

## 2023-05-13 DIAGNOSIS — D860 Sarcoidosis of lung: Secondary | ICD-10-CM

## 2023-05-17 ENCOUNTER — Encounter: Admit: 2023-05-17 | Discharge: 2023-05-17 | Payer: BC Managed Care – PPO

## 2023-05-17 LAB — CBC AND DIFF
ABSOLUTE EOS COUNT: 0.1
ABSOLUTE LYMPH COUNT: 0.9 — ABNORMAL LOW
ABSOLUTE MONO COUNT: 0.3
ABSOLUTE NEUTROPHIL: 5.5
BASOPHILS %: 0.1
EOSINOPHIL %: 1.4
HEMATOCRIT: 46
HEMATOCRIT: 46
HEMOGLOBIN: 14
HEMOGLOBIN: 14
IMMATURE GRANS%: 0.6
LYMPHOCYTES %: 12 — ABNORMAL LOW
MCH: 28
MCHC: 32 — ABNORMAL LOW
MCV: 87
MONOCYTES %: 5.3
MPV: 9 — ABNORMAL LOW
NEUTROPHILS %: 79 — ABNORMAL HIGH
PLATELET COUNT: 198
RBC COUNT: 5.2
RBC COUNT: 5.2
RDW - SD: 44 — ABNORMAL HIGH
RDW: 13
WBC COUNT: 6.9
WBC COUNT: 6.9

## 2023-05-17 NOTE — Telephone Encounter
 Received VM from [facility] pharmacy stating there is a drug interaction between MTX and Bactrim that can increase the effectiveness of the MTX. Pharmacist states that the drug interaction is lower with 3x week PJP prophylaxis dosing. Pharmacy wanting to confirm it is okay with Dr. Samuella Cota before filling script. Requesting call back to verify.

## 2023-05-18 ENCOUNTER — Encounter: Admit: 2023-05-18 | Discharge: 2023-05-18 | Payer: BC Managed Care – PPO

## 2023-05-18 DIAGNOSIS — Z79899 Other long term (current) drug therapy: Secondary | ICD-10-CM

## 2023-05-18 DIAGNOSIS — D860 Sarcoidosis of lung: Secondary | ICD-10-CM

## 2023-05-25 ENCOUNTER — Ambulatory Visit: Admit: 2023-05-25 | Discharge: 2023-05-26

## 2023-05-25 ENCOUNTER — Encounter: Admit: 2023-05-25 | Discharge: 2023-05-25

## 2023-05-25 DIAGNOSIS — Z136 Encounter for screening for cardiovascular disorders: Secondary | ICD-10-CM

## 2023-05-25 DIAGNOSIS — I10 Essential (primary) hypertension: Secondary | ICD-10-CM

## 2023-05-25 DIAGNOSIS — R9431 Abnormal electrocardiogram [ECG] [EKG]: Secondary | ICD-10-CM

## 2023-05-25 DIAGNOSIS — I272 Pulmonary hypertension, unspecified: Secondary | ICD-10-CM

## 2023-05-25 DIAGNOSIS — R0609 Other forms of dyspnea: Secondary | ICD-10-CM

## 2023-05-25 DIAGNOSIS — E66812 Class 2 obesity with body mass index (BMI) of 36.0 to 36.9 in adult, unspecified obesity type, unspecified whether serious comorbidity present: Secondary | ICD-10-CM

## 2023-05-25 NOTE — Progress Notes
 Date of Service: 05/25/2023    Luis Griffith is a 53 y.o. male.       HPI   I saw Luis Griffith on March 09, 2023.  At that time he reported nondiagnostic chest discomfort that was likely musculoskeletal but more concerning he reported progressive dyspnea with exertion.  I was concerned and recommended immediate hospitalization.  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  He has received steroid therapy along with methotrexate and reports significant improvement with reduction in dyspnea with exertion and i enhanced exercise tolerance.Marland Kitchen  His CT scan did show incidental mild coronary calcification but his PET stress myocardial perfusion imaging scan showed no evidence of myocardial ischemia.  Over the past 2 months he has done well from a cardiovascular perspective. The patient has been doing well and reports no angina, congestive symptoms, palpitations, sensation of sustained forceful heart pounding, lightheadedness or syncope.  His exercise tolerance has been stable. The patient reports no myalgias, bleeding abnormalities, or strokelike symptoms.         Vitals:    05/25/23 0847   BP: 131/83   BP Source: Arm, Left Upper   Pulse: 57   SpO2: 96%   PainSc: Zero   Weight: 111 kg (244 lb 12.8 oz)   Height: 175.3 cm (5' 9)     Body mass index is 36.15 kg/m?Marland Kitchen     Past Medical History  Patient Active Problem List    Diagnosis Date Noted    Abnormal EKG 03/09/2023    Dyspnea on exertion 03/09/2023    Obesity 03/09/2023    Pulmonary hypertension (CMS-HCC) 03/09/2023    Shortness of breath 03/09/2023    Right carpal tunnel syndrome 11/19/2022    Essential hypertension 09/23/2017    Hyperlipidemia 09/22/2017     Last Assessment & Plan: Formatting of this note might be different from the original.  FLP not at goal; LDL 116 and HDL 39.    Plan: Will hold off on starting any treatment pending stress test results. Review of Systems   Constitutional: Negative.   HENT: Negative.     Eyes: Negative.    Cardiovascular:  Positive for dyspnea on exertion.   Respiratory: Negative.     Endocrine: Negative.    Hematologic/Lymphatic: Negative.    Skin: Negative.    Musculoskeletal: Negative.    Gastrointestinal: Negative.    Genitourinary: Negative.    Neurological: Negative.    Psychiatric/Behavioral: Negative.     Allergic/Immunologic: Negative.        Physical Exam  GENERAL: The patient is well developed, well nourished, resting comfortably and in no distress.   HEENT: No abnormalities of the visible oro-nasopharynx, conjunctiva or sclera are noted.  NECK: There is no jugular venous distension. Carotids are palpable and without bruits. There is no thyroid enlargement.  Chest: Lung fields are clear to auscultation. There are no wheezes or crackles.  CV: There is a regular rhythm. The first and second heart sounds are normal. There are no murmurs, gallops or rubs.  ABD: The abdomen is soft and supple with normal bowel sounds. There is no hepatosplenomegaly, ascites, tenderness, masses or bruits.  Neuro: There are no focal motor defects. Ambulation is normal. Cognitive function appears normal.  Ext: There is no edema or evidence of deep vein thrombosis. Peripheral pulses are satisfactory.    SKIN: There are no rashes and no cellulitis  PSYCH: The patient is calm,  rationale and oriented.    Cardiovascular Studies  A twelve-lead ECG obtained on March 09, 2023 shows normal sinus rhythm with a heart rate of 76 bpm.  There is no evidence of myocardial ischemia or infarction.  Echo Doppler 03/10/2023:  Interpretation Summary  Normal left ventricular size and wall thickness. Normal left ventricular systolic function with an estimated ejection fraction of 55-60%. Normal left ventricular diastolic function.  Normal right ventricular size and systolic function.  Normal biatrial size.  Normal central venous pressure (0-5 mm Hg).  No hemodynamically significant valvular abnormalities.  The aortic root and ascending aorta are normal in size.  No pericardial effusion.  No prior studies available for comparison. Please see full report below.     PET/CT stress test 03/11/2023:  IMPRESSION   1. Cardiac PET/CT stress myocardial perfusion imaging study is normal.   High risk features are absent . No definite perfusion abnormalities are   identified.   2. Overall left ventricular systolic function is normal with calculated   resting EF of 60 %.   3. Attenuation CT demonstrates: No significant coronary artery   calcification.   4. The quantitative myocardial blood flow is normal globally and   regionally both at rest and with stress. There is both globally and   regionally normal myocardial flow reserve in all 3 vascular territories.   This finding has been found in clinical studies to be usually indicative   of a low probability of significant obstructive CAD and suggestive of an   overall favorable prognosis.   5. Nonischemic ECG response to pharmacologic stress test.     CT chest 03/10/2023:  MPRESSION   Larger mediastinal and bilateral hilar lymphadenopathy since 2022, favored   to reflect sarcoidosis or granulomatous infection. Lymphoproliferative   disorder is less likely though not excluded.     New basal predominant septal thickening with some groundglass opacities   and suggestion of fissural micronodularity, most likely and additional   manifestation of sarcoidosis. Edema or atypical infection are other   possibilities.     Consider bronchoscopic guided biopsy of lymphadenopathy for more   definitive diagnosis.     Cardiovascular Health Factors  Vitals BP Readings from Last 3 Encounters:   05/25/23 131/83   04/14/23 (!) 152/72   03/23/23 114/74     Wt Readings from Last 3 Encounters:   05/25/23 111 kg (244 lb 12.8 oz)   04/14/23 108.9 kg (240 lb)   03/23/23 109.8 kg (242 lb 1 oz)     BMI Readings from Last 3 Encounters:   05/25/23 36.15 kg/m? 04/14/23 35.44 kg/m?   03/23/23 35.75 kg/m?      Smoking Social History     Tobacco Use   Smoking Status Some Days    Current packs/day: 0.25    Types: Cigarettes   Smokeless Tobacco Current    Types: Chew      Lipid Profile Cholesterol   Date Value Ref Range Status   03/09/2023 105 <200 mg/dL Final     HDL   Date Value Ref Range Status   03/09/2023 37 (L) >40 mg/dL Final     LDL   Date Value Ref Range Status   03/09/2023 59 <100 mg/dL Final     Triglycerides   Date Value Ref Range Status   03/09/2023 124 <150 mg/dL Final      Blood Sugar Hemoglobin A1C   Date Value Ref Range Status   03/09/2023 5.7 4.0 - 5.7 % Final  Comment:     The ADA recommends that most patients with type 1 and type 2 diabetes maintain an A1c level <7%.     Glucose   Date Value Ref Range Status   05/12/2023 99  Final   03/11/2023 107 (H) 70 - 100 mg/dL Final   16/11/9602 540 (H) 70 - 100 mg/dL Final          Problems Addressed Today  Encounter Diagnoses   Name Primary?    Screening for heart disease     Pulmonary hypertension (CMS-HCC)     Essential hypertension     Class 2 obesity with body mass index (BMI) of 36.0 to 36.9 in adult, unspecified obesity type, unspecified whether serious comorbidity present     Dyspnea on exertion     Abnormal EKG        Assessment and Plan   Mr. Bracco has established with the pulmonary medicine clinic at [facility] and is receiving treatment for pulmonary sarcoid.  He reports significant improvement over the past 2 months.  He reports that his blood pressure is well-controlled and he is tolerating statin therapy for hyperlipidemia.  Cardiovascular risk factor modification was reviewed in great detail.  I have asked him to return for follow-up in 1 year. The total time spent during this interview and exam with preparation and chart review was 30 minutes.         Current Medications (including today's revisions)   albuterol sulfate (PROAIR HFA) 90 mcg/actuation HFA aerosol inhaler Inhale one puff to two puffs by mouth into the lungs every 6 hours as needed.    amLODIPine (NORVASC) 10 mg tablet Take one tablet by mouth daily.    fish oil /omega-3 fatty acids (SEA-OMEGA) 340/1000 mg capsule Take one capsule by mouth daily with breakfast.    FLUoxetine (PROZAC) 20 mg capsule Take one capsule by mouth daily.    folic acid (FOLVITE) 1 mg tablet Take 1 tab daily, except for the day Methotrexate is taken, then increase to 5mg .    methotrexate sodium 2.5 mg tablet Take six tablets by mouth every 7 days.    pantoprazole DR (PROTONIX) 40 mg tablet Take one tablet by mouth daily.    predniSONE (DELTASONE) 20 mg tablet Take 40mg  daily for 4 weeks, then take 30mg  daily for 4 weeks, then take 20mg  daily until next appointment with Dr. Samuella Cota.    rosuvastatin (CRESTOR) 20 mg tablet Take one tablet by mouth every evening.    sulfamethoxazole 800 mg-trimethoprim 160 mg tablet (BACTRIM DS) Take one tablet by mouth three times weekly. Take on Mon, Wed, Fri while prednisone dose is 20mg  or more.

## 2023-05-25 NOTE — Patient Instructions
 Thank you for visiting our office today.    We would like to make the following medication adjustments:  NONE       Otherwise continue the same medications as you have been doing.          We will be pursuing the following tests after your appointment today:            We will plan to see you back in 12 months.  Please call us in the meantime with any questions or concerns.        Please allow 5-7 business days for our providers to review your results. All normal results will go to MyChart. If you do not have Mychart, it is strongly recommended to get this so you can easily view all your results. If you do not have mychart, we will attempt to call you once with normal lab and testing results. If we cannot reach you by phone with normal results, we will send you a letter.  If you have not heard the results of your testing after one week please give [place] a call.       Your Cardiovascular Medicine [place] [place] [facility] Team Brett Canales, Pilar Jarvis, Shawna Orleans, and [place])  phone number is [phone].

## 2023-06-14 ENCOUNTER — Encounter: Admit: 2023-06-14 | Discharge: 2023-06-14 | Payer: BLUE CROSS/BLUE SHIELD

## 2023-06-14 ENCOUNTER — Ambulatory Visit: Admit: 2023-06-14 | Discharge: 2023-06-15 | Payer: BLUE CROSS/BLUE SHIELD

## 2023-06-14 DIAGNOSIS — D849 Immunodeficiency, unspecified: Secondary | ICD-10-CM

## 2023-06-14 MED ORDER — PREDNISONE 5 MG PO TAB
ORAL_TABLET | 0 refills | Status: AC
Start: 2023-06-14 — End: ?

## 2023-06-14 MED ORDER — SYRINGE WITH NEEDLE 1 ML 25 GAUGE X 5/8" MISC SYRG
0 refills | 28.00000 days | Status: AC
Start: 2023-06-14 — End: ?

## 2023-06-14 MED ORDER — METHOTREXATE (PF) 20 MG/0.4 ML SC ATIN
20 mg | SUBCUTANEOUS | 0 refills | Status: AC
Start: 2023-06-14 — End: ?

## 2023-06-14 NOTE — Progress Notes
 Follow Up Visit  Sarcoidosis  [facility]      Date of Service: 06/14/23    Subjective     History of Present Illness    53 y.o. year old male with PMH HTN, HPL who presents to establish care for Pulmonary Sarcoidosis.     04/2021  Patient was diagnosed with pneumonia - felt tired, exhausted and shortness of breath  Was treated with prednisone  and doxycycline - took if for 5 days - took a while to get better but eventually did     12/2022  Felt incredibly short of breath - slowly occurred over a couple weeks   Went to his PCP - was sent to Cardiology      02/2023  PET scan normal  Stress Test normal  Ultimately sent for EBUS and BAL - biopsy consistent with granulomas      03/2023  Was placed on 5 mg of prednisone  and feels a bit better   Established care in sarcoid clinic - started on MTX and Prednisone  taper     States that breathing is improved since his last visit. Main issue now is his joints - does have bilateral hand and wrist swelling. Does have diarrhe and fatigue with the MTX. Remains on 20 mg of prednisone .         Review of Systems  Positive symptoms in bold  CONSTITUTIONAL: fatigue, fevers, night sweats, weight loss  HEENT: epistaxis, rhinorrhea, changes in vision, double vision, blurry vision  CV: palpitations, chest pain, LE edema  RESP: shortness of breath, wheezing, cough, productive sputum  GI: nausea, vomiting, abdominal pain, diarrhea, melena, hematochezia  GU: dysuria, hematuria, frequency, urgency, flank pain  HEME: easy bleeding, bruising, gingival bleeding, lymphadenopathy  MUSK: thoracic pain, lumbar pain, cervical pain, arthralgia  NEURO: headache, weakness, gait instability  PSYCH: altered mood, depression, manic symptoms          Objective:         Current Outpatient Medications on File Prior to Visit   Medication Sig Dispense Refill    albuterol  sulfate (PROAIR  HFA) 90 mcg/actuation HFA aerosol inhaler Inhale one puff to two puffs by mouth into the lungs every 6 hours as needed.      amLODIPine (NORVASC) 10 mg tablet Take one tablet by mouth daily.      fish oil /omega-3 fatty acids (SEA-OMEGA) 340/1000 mg capsule Take one capsule by mouth daily with breakfast.      FLUoxetine (PROZAC) 20 mg capsule Take one capsule by mouth daily.      folic acid  (FOLVITE ) 1 mg tablet Take 1 tab daily, except for the day Methotrexate  is taken, then increase to 5mg . 44 tablet 11    methotrexate  sodium 2.5 mg tablet Take six tablets by mouth every 7 days. 24 tablet 0    pantoprazole  DR (PROTONIX ) 40 mg tablet Take one tablet by mouth daily. 90 tablet 11    predniSONE  (DELTASONE ) 20 mg tablet Take 40mg  daily for 4 weeks, then take 30mg  daily for 4 weeks, then take 20mg  daily until next appointment with Dr. Brinda Canary. 100 tablet 1    rosuvastatin (CRESTOR) 20 mg tablet Take one tablet by mouth every evening.      sulfamethoxazole  800 mg-trimethoprim  160 mg tablet (BACTRIM  DS) Take one tablet by mouth three times weekly. Take on Mon, Wed, Fri while prednisone  dose is 20mg  or more. 36 tablet 1     No current facility-administered medications on file prior to visit.  Vitals:    06/14/23 1007   BP: (!) 141/78   Pulse: 70   Temp: 36.5 ?C (97.7 ?F)   Resp: 18   SpO2: 99%     Body mass index is 35.44 kg/m?Aaron Aas      Physical Exam  General: Awake alert, well-appearing, no acute distress  HEENT: Atraumatic, normocephalic, PERRL, EOMI, neck supple, no JVD  CV: RRR, no m/r/g, no LE edema  RESP: Lungs CTAB, no wheezes/rhonchi/rales  GI: Abdomen soft, nontender, non distended, +BS  MUSK: No focal pinpoint tenderness, joints ROM grossly in tact  Neuro: CN II-XII grossly in tact, no focal deficits  Derm: no rashes, lesions, abnormal pigmentation    Pulmonary Function Tests:   Complete PFT Absolute FVC-Pre FEV1-Pre RVPleth-Pre TLCPleth-Pre DLCOunc-%Pred-Pre   Latest Ref Rng & Units L L L L %   03/10/2023   9:52 AM 3.15  2.47       04/14/2023   9:01 AM 3.17  2.55  1.68  5.03  83      Complete PFT Percent Predicted FVC-%Pred-pre FEV1-%Pred-Pre RVPleth-%Pred-Pre TLCPleth-%Pred-Pre DLCOunc-%Pred-Pre   Latest Ref Rng & Units % % % % %   03/10/2023   9:52 AM 72  71       04/14/2023   9:01 AM 72  73  80  74  83        Imaging  CT Chest 02/2023  Larger mediastinal and bilateral hilar lymphadenopathy since 2022, favored   to reflect sarcoidosis or granulomatous infection. Lymphoproliferative   disorder is less likely though not excluded.      Pathology      Assessment       Assessment and Plan:  52 year old male with PMH of sarcoidosis who presents for follow up.     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 with his progression over time; micro has been negative  Started on prednisone  03/2023 - remains on 20 mg; will plan to wean to 10 mg x4 weeks, 5 mg x2 weeks, 5 mg every other day and then off  Stop Bactrim   Given diarrhea symptoms will transition PO to SubQ MTX and increase to 20 mg qweekly; can consider addition of HCQ in the future if joint symptoms are ongoing     Total Time Today was 30 minutes in the following activities: Preparing to see the patient, Obtaining and/or reviewing separately obtained history, Performing a medically appropriate examination and/or evaluation, Counseling and educating the patient/family/caregiver, Ordering medications, tests, or procedures, Referring and communication with other health care professionals (when not separately reported), Documenting clinical information in the electronic or other health record, Independently interpreting results (not separately reported) and communicating results to the patient/family/caregiver, and Care coordination (not separately reported)    Basil Boston MD  Pulmonary and Critical Care

## 2023-06-14 NOTE — Patient Instructions
 Clinic Visit Summary:     Next clinic visit follow up with Belton Boy, APRN recommended in 3 months with Chest CT, at any location.     Please contact Pulmonary Nurse Coordinator with signs and symptoms of worsening productive cough with thick secretions, blood in sputum, chest tightness/pain, shortness of breath, fever, chills, night sweats, or any questions or concerns.     ILD/Sarcoidosis Clinical Care Coordinators: Doreatha Gamer, RN [phone], Johanna Mutter, RN [phone], Kattie Parrot, RN [phone], and Eulas Hick, RN [phone].    For refills on medications, please have your pharmacy fax a refill authorization request form to our office at Fax) [phone]. Please allow at least 3 business days for refill requests.   For urgent issues after business hours/weekends/holidays call [phone] and request for the pulmonary fellow to be paged. For scheduling questions/concerns, please call [phone].

## 2023-06-15 DIAGNOSIS — D860 Sarcoidosis of lung: Secondary | ICD-10-CM

## 2023-06-18 ENCOUNTER — Encounter: Admit: 2023-06-18 | Discharge: 2023-06-18 | Payer: BLUE CROSS/BLUE SHIELD

## 2023-06-18 NOTE — Telephone Encounter
 RN completed PA for injectable MTX through covermymeds. Awaiting determination.

## 2023-06-22 ENCOUNTER — Encounter: Admit: 2023-06-22 | Discharge: 2023-06-22 | Payer: BLUE CROSS/BLUE SHIELD

## 2023-06-24 ENCOUNTER — Encounter: Admit: 2023-06-24 | Discharge: 2023-06-24 | Payer: BLUE CROSS/BLUE SHIELD

## 2023-07-02 ENCOUNTER — Encounter: Admit: 2023-07-02 | Discharge: 2023-07-02 | Payer: BLUE CROSS/BLUE SHIELD

## 2023-07-03 MED FILL — METHOTREXATE SODIUM 25 MG/ML IJ SOLN: 25 mg/mL | SUBCUTANEOUS | 14 days supply | Qty: 4 | Fill #1 | Status: AC

## 2023-07-05 ENCOUNTER — Encounter: Admit: 2023-07-05 | Discharge: 2023-07-05 | Payer: BLUE CROSS/BLUE SHIELD

## 2023-07-06 ENCOUNTER — Encounter: Admit: 2023-07-06 | Discharge: 2023-07-06 | Payer: BLUE CROSS/BLUE SHIELD

## 2023-07-06 NOTE — Telephone Encounter
 Received VM from [employer] stating they are sending over disability paperwork for this pt to be completed by Dr. Brinda Canary. Once completed, requesting it be faxed back to them.     Form received and sent to Dr. Brinda Canary for review/completion.

## 2023-07-13 ENCOUNTER — Encounter: Admit: 2023-07-13 | Discharge: 2023-07-13 | Payer: BLUE CROSS/BLUE SHIELD

## 2023-07-13 ENCOUNTER — Ambulatory Visit: Admit: 2023-07-13 | Discharge: 2023-07-14 | Payer: BLUE CROSS/BLUE SHIELD

## 2023-07-13 MED ORDER — METHOTREXATE SODIUM 25 MG/ML IJ SOLN
20 mg | SUBCUTANEOUS | 0 refills | 28.00000 days | Status: AC
Start: 2023-07-13 — End: ?

## 2023-07-13 MED ORDER — SYRINGE WITH NEEDLE 1 ML 25 GAUGE X 5/8" MISC SYRG
0 refills | 28.00000 days | Status: AC
Start: 2023-07-13 — End: ?

## 2023-07-13 NOTE — Progress Notes
 Specialty Medication Injection Education    Medication name: METHOTREXATE  SODIUM 25 MG/ML IJ SOLN    Clinic Pharmacist Injection Education  Patient presents today for teaching on their subcutaneous specialty medication.    Patient was educated on proper administration technique as well as storage/disposal. The patient demonstrated correct technique after instruction.    The patient successfully injected the medication into the thigh (right side). Next dose due on 07/20/2023.    Discussed taking folic acid  5mg  on day of his methotrexate  and taking folic acid  1mg  on all other days. Prescription for SQ syringes will be sent to the pharmacy with his next fill (syringes previously filled at [facility] pharmacy).    The monitoring and follow-up plan was discussed with the patient. The patient was instructed to contact their health care provider if their symptoms or health problems do not get better or if they become worse. Luis Griffith was instructed to contact the specialty pharmacy at [phone] if they have any questions or concerns regarding their medication therapy.    Luis Griffith, PHARMD

## 2023-08-10 ENCOUNTER — Encounter: Admit: 2023-08-10 | Discharge: 2023-08-10 | Payer: BLUE CROSS/BLUE SHIELD

## 2023-08-11 ENCOUNTER — Encounter: Admit: 2023-08-11 | Discharge: 2023-08-11 | Payer: BLUE CROSS/BLUE SHIELD

## 2023-08-11 LAB — CBC AND DIFF
ABSOLUTE EOS COUNT: 0.1
ABSOLUTE IMM GRANULOCYTES: 0
ABSOLUTE LYMPH COUNT: 1.2
ABSOLUTE MONO COUNT: 0.4
ABSOLUTE NEUTROPHIL: 5.4
BASOPHILS %: 0.3
EOSINOPHIL %: 2.3
HEMATOCRIT: 46
HEMOGLOBIN: 15
IMMATURE GRANS%: 0.9
LYMPHOCYTES %: 17 — ABNORMAL LOW
MCH: 28
MCHC: 33
MCV: 86
MONOCYTES %: 6.4
MPV: 8.9 — ABNORMAL LOW
NEUTROPHILS %: 73 — ABNORMAL HIGH
PLATELET COUNT: 218
RBC COUNT: 5.3
RDW - SD: 42
RDW: 13
WBC COUNT: 7.5

## 2023-08-11 NOTE — Telephone Encounter
 Received CBC w Diff collected 08/11/23 @ 0902 from [facility]. Results will be scanned into on base and sent to Dr Brinda Canary for review.

## 2023-08-12 ENCOUNTER — Encounter: Admit: 2023-08-12 | Discharge: 2023-08-12 | Payer: BLUE CROSS/BLUE SHIELD

## 2023-08-12 DIAGNOSIS — Z79899 Other long term (current) drug therapy: Secondary | ICD-10-CM

## 2023-08-12 DIAGNOSIS — D860 Sarcoidosis of lung: Secondary | ICD-10-CM

## 2023-08-12 MED ORDER — METHOTREXATE SODIUM 25 MG/ML IJ SOLN
20 mg | SUBCUTANEOUS | 0 refills | 28.00000 days | Status: AC
Start: 2023-08-12 — End: ?
  Filled 2023-08-13: qty 4, 28d supply, fill #1

## 2023-08-13 ENCOUNTER — Encounter: Admit: 2023-08-13 | Discharge: 2023-08-13 | Payer: BLUE CROSS/BLUE SHIELD

## 2023-08-30 ENCOUNTER — Encounter: Admit: 2023-08-30 | Discharge: 2023-08-30 | Payer: BLUE CROSS/BLUE SHIELD

## 2023-08-30 NOTE — Telephone Encounter
 RN refaxed disability paperwork to [employer]. Fax confirmation received.

## 2023-09-09 ENCOUNTER — Encounter: Admit: 2023-09-09 | Discharge: 2023-09-09 | Payer: BLUE CROSS/BLUE SHIELD

## 2023-09-21 ENCOUNTER — Encounter: Admit: 2023-09-21 | Discharge: 2023-09-21 | Payer: BLUE CROSS/BLUE SHIELD

## 2023-09-24 NOTE — Progress Notes
 Date of Service: 09/27/2023    Subjective:             Luis Griffith is a 53 y.o. male with PMH HTN, HPL, Pulmonary Sarcoidosis.     History of Present Illness  He is a patient known to Luis Griffith who is here for routine follow up of sarcoidosis.  He is accompanied to clinic by his wife today.  Last seen by Luis Griffith 06/14/23.    His biggest complaint today is his fatigue, which has been ongoing and debilitating, preventing him from working since January and leading him to file for disability. Planning to retire next month. The fatigue is severe, allowing him to stay awake for only a couple of hours at a time and preventing him from being active. The fatigue worsened after stopping prednisone  in June. He also reports frequent headaches, particularly upon waking. He has had a sleep apnea which reportedly did not show any OSA.    He reports that exertional dyspnea, cough, and wheezing remains mild and stable.  Joint pain seems to be overall pretty stable.  Reflux remains well controlled with pantoprazole .  Initially had improved diarrhea with the transition to SQ methotrexate  but has been having daily diarrhea again. Ran out of the medication - has not had it in 2 weeks.    Clinical Summary:  04/2021  Patient was diagnosed with pneumonia - felt tired, exhausted and shortness of breath  Was treated with prednisone  and doxycycline - took if for 5 days - took a while to get better but eventually did     12/2022  Felt incredibly short of breath - slowly occurred over a couple weeks   Went to his PCP - was sent to Cardiology      02/2023  PET scan normal  Stress Test normal  Ultimately sent for EBUS and BAL - biopsy consistent with granulomas      03/2023  Was placed on 5 mg of prednisone  and feels a bit better   Established care in sarcoid clinic - started on MTX and Prednisone  taper    05/2023  Seen in follow up - breathing improving but ongoing hand and wrist pain  Continues on MTX - having diarrhea and fatigue - switched to SQ MTX    Past Medical History:    Anxiety and depression    Arthritis    Asthma    Carpal tunnel syndrome, bilateral    Chest pain    COPD (chronic obstructive pulmonary disease) (CMS-HCC)    Dyspnea    Hyperlipidemia    Hypertension    Infection    Inflammatory arthritis    Obesity    Pneumonia    Pulmonary hypertension (CMS-HCC)    Undiagnosed cardiac murmurs    Wears glasses            Review of Systems:  A full 14 point review of systems was performed and is as above or is unremarkable.      Objective:          albuterol  sulfate (PROAIR  HFA) 90 mcg/actuation HFA aerosol inhaler Inhale one puff to two puffs by mouth into the lungs every 6 hours as needed.    amLODIPine (NORVASC) 10 mg tablet Take one tablet by mouth daily.    fish oil /omega-3 fatty acids (SEA-OMEGA) 340/1000 mg capsule Take one capsule by mouth daily with breakfast.    FLUoxetine (PROZAC) 20 mg capsule Take one capsule by mouth daily.    folic  acid (FOLVITE ) 1 mg tablet Take 1 tab daily, except for the day Methotrexate  is taken, then increase to 5mg .    methotrexate  (presrv) 25 mg/mL injection Inject 0.8 mL under the skin every 7 days.    pantoprazole  Luis (PROTONIX ) 40 mg tablet Take one tablet by mouth daily.    predniSONE  (DELTASONE ) 5 mg tablet Take 10mg  daily for 4 weeks, then take 5mg  daily for 2 weeks, then take 5mg  every other day for 2 weeks, then stop.    rosuvastatin (CRESTOR) 20 mg tablet Take one tablet by mouth every evening.    Syringe with Needle (Disp) 1 mL 25 gauge x 5/8 syrg To be used for methotrexate  injections.     Vitals:    09/27/23 1043   BP: (!) 147/70   BP Source: Arm, Right Upper   Pulse: 80   Temp: 36.5 ?C (97.7 ?F)   Resp: 16   SpO2: 97%   TempSrc: Oral   Weight: 116.1 kg (256 lb)   Height: 175.3 cm (5' 9)     Body mass index is 37.8 kg/m?[name]     Physical Exam  Vitals reviewed.   Pulmonary:      Effort: Pulmonary effort is normal.      Breath sounds: Normal breath sounds.         REVIEW OF DATA:  Pulmonary Function Tests:   Complete PFT Absolute FVC-Pre FEV1-Pre RVPleth-Pre TLCPleth-Pre DLCOunc-%Pred-Pre   Latest Ref Rng & Units L L L L %   03/10/2023   9:52 AM 3.15  2.47       04/14/2023   9:01 AM 3.17  2.55  1.68  5.03  83        Complete PFT Percent Predicted FVC-%Pred-pre FEV1-%Pred-Pre RVPleth-%Pred-Pre TLCPleth-%Pred-Pre DLCOunc-%Pred-Pre   Latest Ref Rng & Units % % % % %   03/10/2023   9:52 AM 72  71       04/14/2023   9:01 AM 72  73  80  74  83      Imaging  CT Chest 02/2023  Larger mediastinal and bilateral hilar lymphadenopathy since 2022, favored   to reflect sarcoidosis or granulomatous infection. Lymphoproliferative   disorder is less likely though not excluded.     CT Chest 09/2023       Pathology              Assessment and Plan:  This is a 53 y.o. 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  -It is difficult to determine whether his significant fatigue is more mediated by sarcoidosis or methotrexate , however with his ongoing diarrhea, I think that we should probably consider alternative treatment. I will discuss with Luis Griffith, however I suspect that azathioprine  would be the next preferred agent. [name] and his wife educated today on side effects and required lab monitoring (handout provided). I will  go ahead and obtain TPMT in anticipation of switching therapy. Additionally I noticed that Luis Griffith had ordered several labs as part of his sarcoidosis workup that have not yet been obtained, so I will have Noretta get these done today as well.  -Exercise as able    Immunosuppressed status  High risk medication use  He will hold methotrexate , likely with plan to switch to azathioprine  as noted above.  TMPT ordered. Discussed need for CBC diff and CMP monthly x 3 then q3 months      Return in about 3 months (around 12/28/2023) for appt with Evalene Ewings with PFTs.    Total time today was 45 minutes in the following activities: Preparing to see the patient, Obtaining and/or reviewing separately obtained history, Performing a medically appropriate examination and/or evaluation, Counseling and educating the patient/family/caregiver, Ordering medications, tests, or procedures, Referring and communication with other health care professionals (when not separately reported), Documenting clinical information in the electronic or other health record, Independently interpreting results (not separately reported) and communicating results to the patient/family/caregiver, and Care coordination (not separately reported), and counseling regarding therapy and side effects of therapy.    Evalene Ewings, APRN-NP    Note to patient: The 21st Century Cures Act makes medical notes like these available to patients in the interest of transparency. However, be advised this is a medical document. It is intended as peer to peer communication. It is written in medical language and may contain abbreviations or verbiage that are unfamiliar. It may appear blunt or direct. Medical documents are intended to carry relevant information, facts as evident, and the clinical opinion of the practitioner.

## 2023-09-27 ENCOUNTER — Encounter: Admit: 2023-09-27 | Discharge: 2023-09-27 | Payer: BLUE CROSS/BLUE SHIELD

## 2023-09-27 ENCOUNTER — Ambulatory Visit: Admit: 2023-09-27 | Discharge: 2023-09-27 | Payer: BLUE CROSS/BLUE SHIELD

## 2023-09-27 DIAGNOSIS — Z79899 Other long term (current) drug therapy: Secondary | ICD-10-CM

## 2023-09-27 DIAGNOSIS — D860 Sarcoidosis of lung: Principal | ICD-10-CM

## 2023-09-27 NOTE — Patient Instructions
 Clinic Visit Summary:     Next clinic visit follow up with Evalene Ewings, APRN recommended in 3 months with PFTs, at any location.     Please contact Pulmonary Nurse Coordinator with signs and symptoms of worsening productive cough with thick secretions, blood in sputum, chest tightness/pain, shortness of breath, fever, chills, night sweats, or any questions or concerns.     ILD/Sarcoidosis Clinical Care Coordinators: Warren League, RN [phone], Beryl Pfeiffer, RN [phone], Wilkie Mall, RN [phone], and Alejandra Pipe RN [phone].    For refills on medications, please have your pharmacy fax a refill authorization request form to our office at Fax) [phone]. Please allow at least 3 business days for refill requests.   For urgent issues after business hours/weekends/holidays call [phone] and request for the pulmonary fellow to be paged. For scheduling questions/concerns, please call [phone].

## 2023-09-28 ENCOUNTER — Encounter: Admit: 2023-09-28 | Discharge: 2023-09-28 | Payer: BLUE CROSS/BLUE SHIELD

## 2023-09-28 DIAGNOSIS — Z79899 Other long term (current) drug therapy: Principal | ICD-10-CM

## 2023-09-28 MED ORDER — AZATHIOPRINE 50 MG PO TAB
ORAL_TABLET | ORAL | 0 refills | 30.00000 days | Status: DC
Start: 2023-09-28 — End: 2023-09-28

## 2023-09-28 MED ORDER — AZATHIOPRINE 50 MG PO TAB
ORAL_TABLET | ORAL | 0 refills | 30.00000 days | Status: AC
Start: 2023-09-28 — End: ?

## 2023-09-28 MED ORDER — AZATHIOPRINE 50 MG PO TAB
ORAL | 0 refills | 30.00000 days | Status: DC
Start: 2023-09-28 — End: 2023-09-28

## 2023-09-28 NOTE — Telephone Encounter
 Called pt to discuss medication change. Pt voicemail box unavailable mychart msg sent.

## 2023-10-01 ENCOUNTER — Encounter: Admit: 2023-10-01 | Discharge: 2023-10-01 | Payer: BLUE CROSS/BLUE SHIELD

## 2023-10-06 ENCOUNTER — Encounter: Admit: 2023-10-06 | Discharge: 2023-10-06 | Payer: BLUE CROSS/BLUE SHIELD

## 2023-11-16 LAB — CBC AND DIFF
ABSOLUTE BASO COUNT: 0
ABSOLUTE BASO COUNT: 0
ABSOLUTE EOS COUNT: 0.3
ABSOLUTE EOS COUNT: 0.3
ABSOLUTE IMM GRANULOCYTES: 0
ABSOLUTE IMM GRANULOCYTES: 0
ABSOLUTE LYMPH COUNT: 1 — ABNORMAL LOW
ABSOLUTE LYMPH COUNT: 1 — ABNORMAL LOW
ABSOLUTE MONO COUNT: 0.5
ABSOLUTE MONO COUNT: 0.5
ABSOLUTE NEUTROPHIL: 5.2
ABSOLUTE NEUTROPHIL: 5.2
BASOPHILS %: 0.4
BASOPHILS %: 0.4
EOSINOPHIL %: 4.8
EOSINOPHIL %: 4.8
HEMATOCRIT: 47
HEMOGLOBIN: 15
IMMATURE GRANS%: 1.1
IMMATURE GRANS%: 1.1
LYMPHOCYTES %: 14 — ABNORMAL LOW
LYMPHOCYTES %: 14 — ABNORMAL LOW
MCH: 27
MCH: 27
MCHC: 32 — ABNORMAL LOW
MCHC: 32 — ABNORMAL LOW
MCV: 85
MONOCYTES %: 6.9
MONOCYTES %: 6.9
MPV: 8.7 — ABNORMAL LOW
MPV: 8.7 — ABNORMAL LOW
NEUTROPHILS %: 72 — ABNORMAL HIGH
NEUTROPHILS %: 72 — ABNORMAL HIGH
PLATELET COUNT: 218
PLATELET COUNT: 218
RBC COUNT: 5.5
RDW: 13
RDW: 13
WBC COUNT: 7.2

## 2023-11-17 ENCOUNTER — Encounter: Admit: 2023-11-17 | Discharge: 2023-11-17 | Payer: BLUE CROSS/BLUE SHIELD

## 2023-11-17 DIAGNOSIS — Z79899 Other long term (current) drug therapy: Principal | ICD-10-CM

## 2023-11-17 NOTE — Telephone Encounter
 Received call from [facility]. Spoke to [place]. She states they only received an order for CBC/diff.  They did not draw CMP on patient.   Richerd Molt, MA

## 2023-11-17 NOTE — Telephone Encounter
 Faxed standed CMP to [facility] [phone]

## 2023-11-17 NOTE — Telephone Encounter
 We only received CBC/diff results on standing lab orders.   Called [facility] @[phone] -[phone]. Left message to please fax CMP results.  Richerd Molt, MA

## 2023-11-19 ENCOUNTER — Encounter: Admit: 2023-11-19 | Discharge: 2023-11-19 | Payer: BLUE CROSS/BLUE SHIELD

## 2023-11-19 DIAGNOSIS — Z79899 Other long term (current) drug therapy: Principal | ICD-10-CM

## 2023-11-19 LAB — COMPREHENSIVE METABOLIC PANEL
A/G RATIO-MP: 1.3
ALBUMIN: 4
ALK PHOSPHATASE: 91
ALT: 28
ANION GAP: 12
AST: 27
BLD UREA NITROGEN: 11
CALCIUM: 9.4
CHLORIDE: 102
CO2: 25
CREATININE: 0.8
GFR ESTIMATED: 103
GLOBULIN: 3.2
GLUCOSE,PANEL: 112 — ABNORMAL HIGH
POTASSIUM: 4.4
SODIUM: 139
TOTAL BILIRUBIN: 0.4
TOTAL PROTEIN: 7.2

## 2023-12-08 ENCOUNTER — Encounter: Admit: 2023-12-08 | Discharge: 2023-12-08 | Payer: BLUE CROSS/BLUE SHIELD

## 2023-12-16 ENCOUNTER — Encounter: Admit: 2023-12-16 | Discharge: 2023-12-16 | Payer: BLUE CROSS/BLUE SHIELD

## 2023-12-16 DIAGNOSIS — Z79899 Other long term (current) drug therapy: Principal | ICD-10-CM

## 2023-12-16 LAB — COMPREHENSIVE METABOLIC PANEL
A/G RATIO-MP: 1.8
ALBUMIN: 4.7
ALK PHOSPHATASE: 93
ALT: 30
AST: 27
BLD UREA NITROGEN: 15
CALCIUM: 9.4
CHLORIDE: 103
CHLORIDE: 103
CO2: 23
CO2: 23
CREATININE: 0.9
GFR ESTIMATED: 97
GLOBULIN: 2.6
GLUCOSE,PANEL: 110 — ABNORMAL HIGH
POTASSIUM: 4.1
POTASSIUM: 4.1
SODIUM: 139
SODIUM: 139
TOTAL BILIRUBIN: 0.4
TOTAL PROTEIN: 7.3

## 2023-12-17 ENCOUNTER — Encounter: Admit: 2023-12-17 | Discharge: 2023-12-17 | Payer: BLUE CROSS/BLUE SHIELD

## 2023-12-17 DIAGNOSIS — Z79899 Other long term (current) drug therapy: Principal | ICD-10-CM

## 2023-12-17 NOTE — Progress Notes [1]
 Labs stable - ok to refill azathioprine  but will you please ask him how many tabs/day he's tolerating prior to sending in refill?

## 2023-12-17 NOTE — Progress Notes [1]
 CMP results received. Uploaded to chart.  Richerd Molt, MA

## 2023-12-17 NOTE — Telephone Encounter [36]
-----   Message from Evalene CHRISTELLA Ewings, [place] sent at 12/16/2023  3:06 PM CDT -----  He should have also had a CBC diff  - Orie will you please contact the lab to see if they have those results? I have confirmed that both orders are in.  Thanks,  Tully  ----- Message -----  From: Joshua Fine, [place]  Sent: 12/16/2023   1:48 PM CDT  To: Evalene CHRISTELLA Ewings, APRN-NP

## 2023-12-17 NOTE — Telephone Encounter [36]
 Called [facility] and medical records. Left messages to have CBC/diff results from 12/16/23 faxed to [facility].  Faxed requests to [facility] and medical records.    Due to similar situation with receiving results last month, both orders were re-faxed with Standing Order Information circled on the order.  Faxes confirmed @ 5:54 am (3 pages)  Richerd Molt, MA

## 2023-12-20 ENCOUNTER — Encounter: Admit: 2023-12-20 | Discharge: 2023-12-20 | Payer: BLUE CROSS/BLUE SHIELD

## 2023-12-20 ENCOUNTER — Ambulatory Visit: Admit: 2023-12-20 | Discharge: 2023-12-21 | Payer: BLUE CROSS/BLUE SHIELD

## 2023-12-20 DIAGNOSIS — M1812 Unilateral primary osteoarthritis of first carpometacarpal joint, left hand: Principal | ICD-10-CM

## 2023-12-20 MED ORDER — LIDOCAINE (PF) 10 MG/ML (1 %) IJ SOLN
1 mL | Freq: Once | INTRAMUSCULAR | 0 refills | Status: CP | PRN
Start: 2023-12-20 — End: ?

## 2023-12-20 MED ORDER — TRIAMCINOLONE ACETONIDE 40 MG/ML IJ SUSP
10 mg | Freq: Once | INTRAMUSCULAR | 0 refills | Status: CP | PRN
Start: 2023-12-20 — End: ?

## 2023-12-20 NOTE — Progress Notes [1]
 Hand Therapy Initial Evaluation and Plan of Care:     Name: Luis Griffith. Luis Griffith  DOB: 2070/08/06  MRN#: [id number]  Referring Physician:  CHRISTELLA Boehringer, MD  Insurance:  CHARON  Injury/Onset Date:  5 years +   Medical Diagnosis:  Left [facility] OA  Treatment Diagnosis: Left CMC OA  Date of Initial Evaluation: 12-20-23  Visit #  1    Subjective:     History of Present Condition/Mechanism of Injury:  Gradual onset    Pain: Left:  Location: Thumb    Pain Rating:   Current: 6/10     Patient Goals:  Pain reduction     Objective:     Evaluation:     Hand Exam     THUMB CMC ARTHRITIS TESTS   Left   THUMB MCP position during pinch:    Flexed    Neutral    XX   Hyperextended        Pinch Test:    Positive    XX   Negative         Treatment:    Left:  Thumb    Treatment Provided:    Therapeutic Exercise: CMC Stabilization - pt instructed in [facility] stabilization exercises.  Pt demonstrated accurately.     Education:Handout Issued: [facility] Stabilization Exercises      Orthosis:    Prefabricated Orthosis Issued: Metagrip CMC Brace -  Size 3    Purpose of Immobilization:To protect joint    Orthosis Instructions:  Verbally instructed in orthosis/brace care and precautions. Verbally instructed in wearing schedule for orthosis/brace. Wear splint at all times except hygiene for 3-4 weeks until pain subsides; then as needed at night and/or with activities.  Pt verbalized understanding.                       Assessment:    Problems: Joint Pain    Rationale for Therapy:Clinical Judgment, Evaluation, and Per Physician Order/Referral    Rehab Potential: Good     Contraindications to Therapy:none     Long Term Treatment Goals:     Patient will get relief from pain to improve healing and quality of life.      Short Term Treatment Goals:     Goal Number:  1  Goal: Patient to verbalize/demonstrate exercises correctly.  Goal Status:  Met   Goal Number:  2  Goal: Patient to verbalize understanding of orthosis instructions including wearing schedule and precautions.  Goal Status:  Met                           Plan of Care:    Treatment Plan:     Issue Prefabricated Orthosis  Exercise: CMC Stabilization    Frequency of treatment:  Will follow per physician request

## 2023-12-20 NOTE — Progress Notes [1]
 [facility] Hand Surgery      Date of Service: 12/20/2023    Subjective:           Left thumb pain    History of Present Illness  This is a 53 year old man who I seen in the past for left thumb CMC arthritis.  We have been managing him conservatively.  He says the injections have been helpful but his pain has returned.  He reports ongoing pain around the base of his thumb which limits the use of his hand.  He denies any suture disturbances.  He has lost his MetaGrip brace.  He does not care to take anti-inflammatory medicines.  Pearl Berlinger is a 53 y.o. male.     Review of Systems      Objective:          albuterol  sulfate (PROAIR  HFA) 90 mcg/actuation HFA aerosol inhaler Inhale one puff to two puffs by mouth into the lungs every 6 hours as needed.    amLODIPine (NORVASC) 10 mg tablet Take one tablet by mouth daily.    azaTHIOprine  (IMURAN ) 50 mg tablet 1 tab daily x's 1 week, then increase to 1 tab twice daily for 7 days, then increase to 2 tabs in the morning and 1 tab at night thereafter.    fish oil /omega-3 fatty acids (SEA-OMEGA) 340/1000 mg capsule Take one capsule by mouth daily with breakfast.    FLUoxetine (PROZAC) 20 mg capsule Take one capsule by mouth daily.    pantoprazole  DR (PROTONIX ) 40 mg tablet Take one tablet by mouth daily.    predniSONE  (DELTASONE ) 5 mg tablet Take 10mg  daily for 4 weeks, then take 5mg  daily for 2 weeks, then take 5mg  every other day for 2 weeks, then stop.    rosuvastatin (CRESTOR) 20 mg tablet Take one tablet by mouth every evening.    Syringe with Needle (Disp) 1 mL 25 gauge x 5/8 syrg To be used for methotrexate  injections.     Vitals:    12/20/23 1158   PainSc: Six     There is no height or weight on file to calculate BMI.     Physical Exam  Constitutional:       General: He is not in acute distress.     Appearance: Normal appearance.   Neurological:      Mental Status: He is oriented to person, place, and time.   Psychiatric: Behavior: Behavior normal.       Ortho Exam  Left hand: The skin is intact, there are no wounds or scars.  There is some mild swelling and enlargement of his thumb CMC joint with associated tenderness and a positive CMC grind test.  He has otherwise full range of motion of the wrist and digits.  There is intact sensation and brisk cap refill to all digits.       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  Conservative care has been helpful and he can certainly try another injection and get a new MetaGrip brace.  If this does not help there are good surgical options which would be a left thumb CMC arthroplasty.  He would like to try another injection and a brace.  He is welcome to follow-up as needed.    Small Joint Drain/Inject: L thumb CMC on 12/20/2023 11:50 AM    Consent:   Consent obtained: verbal  Consent given by: patient  Risks discussed: skin discoloration,  bleeding, damage to surrounding structures, hyperglycemia, infection, pain, soft tissue reaction, subcutaneous fat atrophy, tendon rupture, vasovagal reaction and arrhythmia  Alternatives discussed: alternative treatment, delayed treatment and no treatment  Discussed with patient the purpose of the treatment/procedure, other ways of treating my condition, including no treatment/ procedure and the risks and benefits of the alternatives. Patient has decided to proceed with treatment/procedure.        Universal Protocol:  Relevant documents: relevant documents present and verified  Patient identity confirmed: Patient identify confirmed verbally with patient.          Procedures Details:  Procedure Peformed: Injection Only  Indications: pain  Details:Prep: alcohol   25 G needle, dorsal approachMedications: 1 mL lidocaine  PF 1% (10 mg/mL); 10 mg triamcinolone  acetonide 40 mg/mL  Outcome: tolerated well, no immediate complications                             Donnice Boehringer, MD  Associate Professor  Orthopedic Hand Surgery

## 2023-12-24 ENCOUNTER — Encounter: Admit: 2023-12-24 | Discharge: 2023-12-24 | Payer: BLUE CROSS/BLUE SHIELD

## 2023-12-24 DIAGNOSIS — Z79899 Other long term (current) drug therapy: Principal | ICD-10-CM

## 2023-12-28 NOTE — Progress Notes [1]
 Date of Service: 12/30/2023    Subjective:             Luis Griffith is a 53 y.o. male with PMH HTN, HPL, Pulmonary Sarcoidosis.     History of Present Illness  He is a patient known to Luis Griffith who is here for routine follow up of sarcoidosis. He is unaccompanied to clinic today.  Last seen by Luis Griffith 06/14/23.    Since transitioning to AZA, he feels that his sarcoid has been more active - now with increased shortness of breath, supraclavicular LAD, chest tightness, fatigue.  Cough has been pretty mild. Wheezing comes and goes. Uses albuterol  intermittently with benefit.  Denies any palpitations.  Denies any new joint pain.  Tolerating azathioprine  well - denies any side effects or infectious complications. He occasionally misses doses but states this is not frequent.     Interval history 09/27/23:  His biggest complaint today is his fatigue, which has been ongoing and debilitating, preventing him from working since January and leading him to file for disability. Planning to retire next month. The fatigue is severe, allowing him to stay awake for only a couple of hours at a time and preventing him from being active. The fatigue worsened after stopping prednisone  in June. He also reports frequent headaches, particularly upon waking. He has had a sleep apnea which reportedly did not show any OSA.    He reports that exertional dyspnea, cough, and wheezing remains mild and stable.  Joint pain seems to be overall pretty stable.  Reflux remains well controlled with pantoprazole .  Initially had improved diarrhea with the transition to SQ methotrexate  but has been having daily diarrhea again. Ran out of the medication - has not had it in 2 weeks.    Clinical Summary:  04/2021  Patient was diagnosed with pneumonia - felt tired, exhausted and shortness of breath  Was treated with prednisone  and doxycycline - took if for 5 days - took a while to get better but eventually did     12/2022  Felt incredibly short of breath - slowly occurred over a couple weeks   Went to his PCP - was sent to Cardiology      02/2023  PET scan normal  Stress Test normal  Ultimately sent for EBUS and BAL - biopsy consistent with granulomas      03/2023  Was placed on 5 mg of prednisone  and feels a bit better   Established care in sarcoid clinic - started on MTX and Prednisone  taper    05/2023  Seen in follow up - breathing improving but ongoing hand and wrist pain  Continues on MTX - having diarrhea and fatigue - switched to SQ MTX    Past Medical History:    Anxiety and depression    Arthritis    Asthma    Carpal tunnel syndrome, bilateral    Chest pain    COPD (chronic obstructive pulmonary disease) (CMS-HCC)    Dyspnea    Hyperlipidemia    Hypertension    Infection    Inflammatory arthritis    Obesity    Pneumonia    Pulmonary hypertension (CMS-HCC)    Undiagnosed cardiac murmurs    Wears glasses            Review of Systems:  A full 14 point review of systems was performed and is as above or is unremarkable.      Objective:          albuterol   sulfate (PROAIR  HFA) 90 mcg/actuation HFA aerosol inhaler Inhale one puff to two puffs by mouth into the lungs every 6 hours as needed.    amLODIPine (NORVASC) 10 mg tablet Take one tablet by mouth daily.    azaTHIOprine  (IMURAN ) 50 mg tablet 2 tabs in the morning and 1 tab at night    fish oil /omega-3 fatty acids (SEA-OMEGA) 340/1000 mg capsule Take one capsule by mouth daily with breakfast.    FLUoxetine (PROZAC) 20 mg capsule Take one capsule by mouth daily.    pantoprazole  Luis (PROTONIX ) 40 mg tablet Take one tablet by mouth daily.    predniSONE  (DELTASONE ) 10 mg tablet Take two tablets by mouth daily with breakfast for 14 days, THEN one tablet daily with breakfast for 14 days, THEN one-half tablet daily with breakfast for 14 days. Indications: sarcoidosis    rosuvastatin (CRESTOR) 20 mg tablet Take one tablet by mouth every evening.     Vitals:    12/30/23 1411   BP: (!) 150/80   BP Source: Arm, Right Upper Pulse: 81   Resp: 20   SpO2: 97%   TempSrc: Oral   PainSc: Five   Weight: 115.7 kg (255 lb)   Height: 175.3 cm (5' 9)       Body mass index is 37.66 kg/m?[name]     Physical Exam  Vitals reviewed.   Pulmonary:      Effort: Pulmonary effort is normal.      Breath sounds: Normal breath sounds.         REVIEW OF DATA:  Pulmonary Function Tests:   Complete PFT Absolute FVC-Pre FEV1-Pre RVPleth-Pre TLCPleth-Pre DLCOunc-%Pred-Pre   Latest Ref Rng & Units L L L L %   03/10/2023   9:52 AM 3.15  2.47       04/14/2023   9:01 AM 3.17  2.55  1.68  5.03  83    12/30/2023   1:17 PM 2.99  P 2.49  P 1.41  P 4.53  P 80  P      P Preliminary result       Complete PFT Percent Predicted FVC-%Pred-pre FEV1-%Pred-Pre RVPleth-%Pred-Pre TLCPleth-%Pred-Pre DLCOunc-%Pred-Pre   Latest Ref Rng & Units % % % % %   03/10/2023   9:52 AM 72  71       04/14/2023   9:01 AM 72  73  80  74  83    12/30/2023   1:17 PM 71  P 75  P 69  P 68  P 80  P      P Preliminary result     Imaging  CT Chest 02/2023  Larger mediastinal and bilateral hilar lymphadenopathy since 2022, favored   to reflect sarcoidosis or granulomatous infection. Lymphoproliferative   disorder is less likely though not excluded.     CT Chest 09/2023       Pathology              Assessment and Plan:  This is a 53 y.o. 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 Will plan to update CT chest at follow up - which should correlate with him being on azathioprine  x 6 months. We reviewed the expectation for it to take up to 6-9 months on AZA to note any improvement. Next step in treatment would likely be TNF inhibitor.  -Resume prednisone  20 mg x 2 weeks then 10 mg x 2 weeks then 5 mg x 2 weeks  -Exercise as able  -Declined vaccines    Immunosuppressed status  High risk medication use  He will continue on azathioprine , with dosing as above.  We will continue to monitor for bone marrow toxicity with CBC with differential and renal/hepatic toxicity with CMP every 3 months.  Labs thus far have remained stable.   CBC w/Diff    Lab Results   Component Value Date/Time    WBC 8.00 12/30/2023 03:26 PM    RBC 5.78 (H) 12/30/2023 03:26 PM    HGB 16.0 12/30/2023 03:26 PM    HCT 47.2 12/30/2023 03:26 PM    MCV 81.6 12/30/2023 03:26 PM    MCH 27.7 12/30/2023 03:26 PM    MCHC 33.9 12/30/2023 03:26 PM    RDW 14.3 12/30/2023 03:26 PM    PLTCT 235 12/30/2023 03:26 PM    MPV 7.1 12/30/2023 03:26 PM    Lab Results   Component Value Date/Time    NEUT 75.5 12/30/2023 03:26 PM    ANC 6.10 12/30/2023 03:26 PM    LYMA 15.0 (L) 12/30/2023 03:26 PM    ALC 1.20 12/30/2023 03:26 PM    MONA 7.1 12/30/2023 03:26 PM    AMC 0.60 12/30/2023 03:26 PM    EOSA 2.2 12/30/2023 03:26 PM    AEC 0.20 12/30/2023 03:26 PM    BASA 0.2 12/30/2023 03:26 PM    ABC 0.00 12/30/2023 03:26 PM        Comprehensive Metabolic Profile    Lab Results   Component Value Date/Time    NA 139 12/30/2023 03:26 PM    K 3.9 12/30/2023 03:26 PM    CL 103 12/30/2023 03:26 PM    CO2 26 12/30/2023 03:26 PM    GAP 10 12/30/2023 03:26 PM    BUN 15 12/30/2023 03:26 PM    CR 0.99 12/30/2023 03:26 PM    GLU 91 12/30/2023 03:26 PM    Lab Results   Component Value Date/Time    CA 9.2 12/30/2023 03:26 PM    ALBUMIN 4.7 12/30/2023 03:26 PM    TOTPROT 7.4 12/30/2023 03:26 PM    ALKPHOS 90 12/30/2023 03:26 PM    AST 18 12/30/2023 03:26 PM    ALT 23 12/30/2023 03:26 PM    TOTBILI 0.5 12/30/2023 03:26 PM    GFR >60 12/30/2023 03:26 PM             Return in about 3 months (around 03/31/2024) for appt with Evalene Ewings with CT chest.    Total time today was 40 minutes in the following activities: Preparing to see the patient, Obtaining and/or reviewing separately obtained history, Performing a medically appropriate examination and/or evaluation, Counseling and educating the patient/family/caregiver, Ordering medications, tests, or procedures, Documenting clinical information in the electronic or other health record, Independently interpreting results (not separately reported) and communicating results to the patient/family/caregiver, and Care coordination (not separately reported), and counseling regarding therapy and side  effects of therapy.    Evalene Ewings, APRN-NP    Note to patient: The 21st Century Cures Act makes medical notes like these available to patients in the interest of transparency. However, be advised this is a medical document. It is intended as peer to peer communication. It is written in medical language and may contain abbreviations or verbiage that are unfamiliar. It may appear blunt or direct. Medical documents are intended to carry relevant information, facts as evident, and the clinical opinion of the practitioner.

## 2023-12-30 ENCOUNTER — Encounter: Admit: 2023-12-30 | Discharge: 2023-12-30 | Payer: BLUE CROSS/BLUE SHIELD

## 2023-12-30 ENCOUNTER — Ambulatory Visit: Admit: 2023-12-30 | Discharge: 2023-12-30 | Payer: BLUE CROSS/BLUE SHIELD

## 2023-12-30 VITALS — BP 150/80 | HR 81 | Resp 20 | Ht 69.0 in | Wt 255.0 lb

## 2023-12-30 DIAGNOSIS — D860 Sarcoidosis of lung: Principal | ICD-10-CM

## 2023-12-30 DIAGNOSIS — D849 Immunodeficiency, unspecified: Secondary | ICD-10-CM

## 2023-12-30 MED ORDER — PREDNISONE 10 MG PO TAB
ORAL_TABLET | ORAL | 0 refills | 30.00000 days | Status: AC
Start: 2023-12-30 — End: ?

## 2023-12-30 NOTE — Patient Instructions [37]
 Clinic Visit Summary:     Next clinic visit follow up with Evalene Ewings, APRN recommended in 3 months with CT chest and with Dr Gaylia in 6 months with PFTs, at any location.     Please contact Pulmonary Nurse Coordinator with signs and symptoms of worsening productive cough with thick secretions, blood in sputum, chest tightness/pain, shortness of breath, fever, chills, night sweats, or any questions or concerns.     ILD/Sarcoidosis Clinical Care Coordinators: Warren League, RN [phone], Beryl Pfeiffer, RN [phone], Wilkie Mall, RN [phone], and Alejandra Pipe RN [phone].    For refills on medications, please have your pharmacy fax a refill authorization request form to our office at Fax) [phone]. Please allow at least 3 business days for refill requests.   For urgent issues after business hours/weekends/holidays call [phone] and request for the pulmonary fellow to be paged. For scheduling questions/concerns, please call [phone].

## 2024-01-14 ENCOUNTER — Encounter: Admit: 2024-01-14 | Discharge: 2024-01-14 | Payer: BLUE CROSS/BLUE SHIELD

## 2024-01-14 MED ORDER — AZATHIOPRINE 50 MG PO TAB
ORAL_TABLET | ORAL | 0 refills | 30.00000 days | Status: AC
Start: 2024-01-14 — End: ?

## 2024-01-14 NOTE — Telephone Encounter [36]
 Received refill request from pharmacy for aza. Pt is up to date on labs and has f/u scheduled. Okay to refill per protocol.

## 2024-02-10 ENCOUNTER — Encounter: Admit: 2024-02-10 | Discharge: 2024-02-10 | Payer: BLUE CROSS/BLUE SHIELD

## 2024-02-10 MED ORDER — AZATHIOPRINE 50 MG PO TAB
ORAL_TABLET | ORAL | 2 refills | 30.00000 days | Status: AC
Start: 2024-02-10 — End: ?

## 2024-02-10 NOTE — Telephone Encounter [36]
 Received refill request from pharmacy for Azathioprine . Pt is up to date on labs and has f/u scheduled. Okay to refill per protocol.

## 2024-03-07 ENCOUNTER — Encounter: Admit: 2024-03-07 | Discharge: 2024-03-07 | Payer: BLUE CROSS/BLUE SHIELD

## 2024-03-29 ENCOUNTER — Encounter: Admit: 2024-03-29 | Discharge: 2024-03-29 | Payer: BLUE CROSS/BLUE SHIELD
